# Patient Record
Sex: Male | Born: 1960 | Race: White | Hispanic: No | Marital: Married | Smoking: Never smoker
Health system: Southern US, Community
[De-identification: ages and names within clinical notes are randomized; demographics above are authoritative.]

## PROBLEM LIST (undated history)

## (undated) DIAGNOSIS — K589 Irritable bowel syndrome without diarrhea: Secondary | ICD-10-CM

## (undated) DIAGNOSIS — J358 Other chronic diseases of tonsils and adenoids: Secondary | ICD-10-CM

## (undated) DIAGNOSIS — M1990 Unspecified osteoarthritis, unspecified site: Secondary | ICD-10-CM

## (undated) DIAGNOSIS — E785 Hyperlipidemia, unspecified: Secondary | ICD-10-CM

## (undated) DIAGNOSIS — K219 Gastro-esophageal reflux disease without esophagitis: Secondary | ICD-10-CM

## (undated) HISTORY — PX: BREAST LUMPECTOMY: SHX2

## (undated) HISTORY — PX: SPINAL CORD DECOMPRESSION: SHX97

## (undated) HISTORY — DX: Hyperlipidemia, unspecified: E78.5

## (undated) HISTORY — PX: ESOPHAGOGASTRODUODENOSCOPY: SHX1529

## (undated) HISTORY — DX: Unspecified osteoarthritis, unspecified site: M19.90

## (undated) HISTORY — DX: Gastro-esophageal reflux disease without esophagitis: K21.9

## (undated) HISTORY — DX: Irritable bowel syndrome, unspecified: K58.9

## (undated) HISTORY — DX: Other chronic diseases of tonsils and adenoids: J35.8

---
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
## 1999-04-28 ENCOUNTER — Encounter: Payer: Self-pay | Admitting: Internal Medicine

## 2003-12-27 ENCOUNTER — Ambulatory Visit: Payer: Self-pay | Admitting: Family Medicine

## 2003-12-27 ENCOUNTER — Encounter: Payer: Self-pay | Admitting: Internal Medicine

## 2004-01-09 HISTORY — PX: COLONOSCOPY: SHX174

## 2004-01-09 LAB — HM COLONOSCOPY

## 2004-05-24 ENCOUNTER — Ambulatory Visit: Payer: Self-pay | Admitting: Internal Medicine

## 2004-06-23 ENCOUNTER — Ambulatory Visit: Payer: Self-pay | Admitting: Internal Medicine

## 2004-11-24 ENCOUNTER — Ambulatory Visit: Payer: Self-pay | Admitting: Family Medicine

## 2004-12-25 ENCOUNTER — Ambulatory Visit: Payer: Self-pay | Admitting: Internal Medicine

## 2005-05-15 ENCOUNTER — Ambulatory Visit: Payer: Self-pay | Admitting: Internal Medicine

## 2005-06-21 ENCOUNTER — Emergency Department: Payer: Self-pay | Admitting: Emergency Medicine

## 2005-06-21 ENCOUNTER — Other Ambulatory Visit: Payer: Self-pay

## 2005-12-14 ENCOUNTER — Ambulatory Visit: Payer: Self-pay | Admitting: Internal Medicine

## 2006-11-29 ENCOUNTER — Emergency Department: Payer: Self-pay | Admitting: Emergency Medicine

## 2006-12-12 ENCOUNTER — Encounter: Payer: Self-pay | Admitting: Internal Medicine

## 2006-12-16 ENCOUNTER — Ambulatory Visit: Payer: Self-pay | Admitting: Physician Assistant

## 2007-06-03 ENCOUNTER — Telehealth (INDEPENDENT_AMBULATORY_CARE_PROVIDER_SITE_OTHER): Payer: Self-pay | Admitting: *Deleted

## 2007-08-15 ENCOUNTER — Ambulatory Visit: Payer: Self-pay | Admitting: Internal Medicine

## 2007-08-15 DIAGNOSIS — K219 Gastro-esophageal reflux disease without esophagitis: Secondary | ICD-10-CM | POA: Insufficient documentation

## 2007-10-03 ENCOUNTER — Telehealth: Payer: Self-pay | Admitting: Internal Medicine

## 2007-10-22 ENCOUNTER — Encounter: Payer: Self-pay | Admitting: Internal Medicine

## 2007-10-22 ENCOUNTER — Ambulatory Visit: Payer: Self-pay | Admitting: Family Medicine

## 2008-02-19 ENCOUNTER — Ambulatory Visit: Payer: Self-pay | Admitting: Internal Medicine

## 2008-02-19 DIAGNOSIS — K589 Irritable bowel syndrome without diarrhea: Secondary | ICD-10-CM | POA: Insufficient documentation

## 2008-02-20 LAB — CONVERTED CEMR LAB
Cholesterol: 217 mg/dL (ref 0–200)
Triglycerides: 205 mg/dL (ref 0–149)

## 2008-05-20 ENCOUNTER — Ambulatory Visit: Payer: Self-pay | Admitting: Internal Medicine

## 2008-05-20 DIAGNOSIS — M1990 Unspecified osteoarthritis, unspecified site: Secondary | ICD-10-CM | POA: Insufficient documentation

## 2008-07-28 ENCOUNTER — Ambulatory Visit: Payer: Self-pay | Admitting: Internal Medicine

## 2008-10-22 ENCOUNTER — Ambulatory Visit: Payer: Self-pay | Admitting: Internal Medicine

## 2008-12-15 ENCOUNTER — Ambulatory Visit: Payer: Self-pay | Admitting: Family Medicine

## 2008-12-16 ENCOUNTER — Encounter: Payer: Self-pay | Admitting: Family Medicine

## 2008-12-29 ENCOUNTER — Telehealth: Payer: Self-pay | Admitting: Internal Medicine

## 2009-01-08 HISTORY — PX: VASECTOMY: SHX75

## 2009-02-07 ENCOUNTER — Encounter: Payer: Self-pay | Admitting: Internal Medicine

## 2009-04-22 ENCOUNTER — Ambulatory Visit: Payer: Self-pay | Admitting: Specialist

## 2009-05-12 ENCOUNTER — Telehealth: Payer: Self-pay | Admitting: Family Medicine

## 2009-07-27 ENCOUNTER — Telehealth: Payer: Self-pay | Admitting: Internal Medicine

## 2010-01-07 ENCOUNTER — Ambulatory Visit: Payer: Self-pay | Admitting: Neurosurgery

## 2010-02-08 ENCOUNTER — Ambulatory Visit (INDEPENDENT_AMBULATORY_CARE_PROVIDER_SITE_OTHER): Payer: BC Managed Care – PPO | Admitting: Internal Medicine

## 2010-02-08 ENCOUNTER — Encounter: Payer: Self-pay | Admitting: Internal Medicine

## 2010-02-08 DIAGNOSIS — N419 Inflammatory disease of prostate, unspecified: Secondary | ICD-10-CM | POA: Insufficient documentation

## 2010-02-08 NOTE — Progress Notes (Signed)
Summary: hemorrhoids  Phone Note Call from Patient Call back at Home Phone [phone]   Summary of Call: Patient says that he had surgery on his back last week. He did not have bowel movement for 5 days and finally had one this morning. He says that the stool was very hard and is now having hemorrhoid flare up. He want to know if he can get a new rx for anusol HC  suppositories. [facility]ses [facility].  Initial call taken by: Melody Comas,  May 12, 2009 12:25 PM    New/Updated Medications: ANUSOL-HC 25 MG SUPP (HYDROCORTISONE ACETATE) apply to rectum as needed Prescriptions: ANUSOL-HC 25 MG SUPP (HYDROCORTISONE ACETATE) apply to rectum as needed  #15 grams x 1   Entered and Authorized by:   Ruthe Mannan MD   Signed by:   Ruthe Mannan MD on 05/12/2009   Method used:   [facility]        [id number]       [address]       [place], [place]  [address]       Ph: [phone] or [phone]       Fax: [phone]   RxID:   [id number]

## 2010-02-08 NOTE — Progress Notes (Signed)
Summary: Rx Amitriptyline  Phone Note Refill Request Call back at [phone] Message [id number] on July 27, 2009 7:55 AM  Refills Requested: Medication #1:  AMITRIPTYLINE HCL 25 MG  TABS 1 or 2 at bed time as needed  Method Requested: Electronic Initial call taken by: Sydell Axon LPN,  July 27, 2009 7:55 AM  Follow-up for Phone Call        okay #60 x 1 year Follow-up by: Cindee Salt MD,  July 27, 2009 8:10 AM  Additional Follow-up for Phone Call Additional follow up Details #1::        Refill sent to pharmacy as instructed. Additional Follow-up by: Sydell Axon LPN,  July 27, 2009 9:07 AM

## 2010-02-08 NOTE — Progress Notes (Signed)
Summary: regarding anusol  Phone Note From Pharmacy   [name][facility] [phone] * Summary of Call: Pharmacy is asking for clarification on anusol.  They are asking if you want suppositories or cream.  Script was written for suppositories but quantity was ordered for cream.   Initial call taken by: Lowella Petties CMA,  May 12, 2009 3:29 PM  Follow-up for Phone Call        suppositories please. Ruthe Mannan MD  May 12, 2009 3:38 PM   Additional Follow-up for Phone Call Additional follow up Details #1::        [facility] Additional Follow-up by: Lowella Petties CMA,  May 12, 2009 3:48 PM

## 2010-02-08 NOTE — Letter (Signed)
Summary: Lenard Forth MD  Lenard Forth MD   Imported By: Sherian Rein 02/14/2009 14:21:18  _____________________________________________________________________  External Attachment:    Type:   Image     Comment:   External Document  Appended Document: Lenard Forth MD left elbow injected

## 2010-02-15 ENCOUNTER — Encounter: Payer: Self-pay | Admitting: Internal Medicine
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
## 2010-02-16 ENCOUNTER — Encounter: Payer: Self-pay | Admitting: Internal Medicine

## 2010-02-16 ENCOUNTER — Other Ambulatory Visit: Payer: Self-pay | Admitting: Internal Medicine

## 2010-02-16 ENCOUNTER — Encounter (INDEPENDENT_AMBULATORY_CARE_PROVIDER_SITE_OTHER): Payer: BC Managed Care – PPO | Admitting: Internal Medicine

## 2010-02-16 DIAGNOSIS — K589 Irritable bowel syndrome without diarrhea: Secondary | ICD-10-CM

## 2010-02-16 DIAGNOSIS — Z0000 Encounter for general adult medical examination without abnormal findings: Secondary | ICD-10-CM

## 2010-02-16 LAB — CBC WITH DIFFERENTIAL/PLATELET
Basophils Relative: 0.3 % (ref 0.0–3.0)
Eosinophils Absolute: 0 10*3/uL (ref 0.0–0.7)
Eosinophils Relative: 0.3 % (ref 0.0–5.0)
Hemoglobin: 16.8 g/dL (ref 13.0–17.0)
Lymphocytes Relative: 26.6 % (ref 12.0–46.0)
MCHC: 35.8 g/dL (ref 30.0–36.0)
MCV: 90.1 fl (ref 78.0–100.0)
Neutro Abs: 4.5 10*3/uL (ref 1.4–7.7)
Neutrophils Relative %: 67.1 % (ref 43.0–77.0)
RBC: 5.23 Mil/uL (ref 4.22–5.81)
WBC: 6.7 10*3/uL (ref 4.5–10.5)

## 2010-02-16 LAB — HEPATIC FUNCTION PANEL
ALT: 23 U/L (ref 0–53)
Albumin: 4.8 g/dL (ref 3.5–5.2)
Alkaline Phosphatase: 48 U/L (ref 39–117)
Bilirubin, Direct: 0.2 mg/dL (ref 0.0–0.3)
Total Protein: 7.4 g/dL (ref 6.0–8.3)

## 2010-02-16 LAB — RENAL FUNCTION PANEL
Albumin: 4.8 g/dL (ref 3.5–5.2)
Calcium: 9.7 mg/dL (ref 8.4–10.5)
Chloride: 100 mEq/L (ref 96–112)
Glucose, Bld: 86 mg/dL (ref 70–99)
Phosphorus: 3.5 mg/dL (ref 2.3–4.6)
Potassium: 4.6 mEq/L (ref 3.5–5.1)
Sodium: 137 mEq/L (ref 135–145)
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
## 2010-05-03 ENCOUNTER — Other Ambulatory Visit: Payer: Self-pay | Admitting: *Deleted

## 2010-05-03 MED ORDER — HYDROCORTISONE ACETATE 25 MG RE SUPP: 25.0000 mg | Freq: Two times a day (BID) | RECTAL | Status: AC

## 2010-05-03 NOTE — Telephone Encounter (Signed)
Script called to [facility].

## 2010-05-03 NOTE — Telephone Encounter (Signed)
Reasonable,   Refill #30, 0 refills 1 PR bid prn hemorrhoids

## 2010-05-03 NOTE — Telephone Encounter (Signed)
[facility] on anusol HC suppostories.  This is no longer on pt's med list.  Pt is leaving the country tomorrow morning and is asking that this be called to pharmacy today.  Dr. Alphonsus Sias is gone for the rest of the week.

## 2010-07-10 ENCOUNTER — Ambulatory Visit: Payer: BC Managed Care – PPO | Admitting: Internal Medicine

## 2010-07-10 ENCOUNTER — Encounter: Payer: Self-pay | Admitting: Internal Medicine

## 2010-07-13 ENCOUNTER — Ambulatory Visit (INDEPENDENT_AMBULATORY_CARE_PROVIDER_SITE_OTHER): Payer: BC Managed Care – PPO | Admitting: Internal Medicine

## 2010-07-13 ENCOUNTER — Encounter: Payer: Self-pay | Admitting: Internal Medicine

## 2010-07-13 VITALS — BP 110/70 | HR 89 | Temp 98.4°F | Ht 69.0 in | Wt 219.0 lb

## 2010-07-13 DIAGNOSIS — G479 Sleep disorder, unspecified: Secondary | ICD-10-CM | POA: Insufficient documentation

## 2010-07-13 NOTE — Patient Instructions (Addendum)
Please set up evaluation by the sleep doctor

## 2010-07-13 NOTE — Assessment & Plan Note (Signed)
Doesn't have the physique or throat that suggests apnea Mild daytime somnolence Will set up sleep evaluation to decide if sleep study is appropriate
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
## 2010-08-11 ENCOUNTER — Institutional Professional Consult (permissible substitution): Payer: BC Managed Care – PPO | Admitting: Pulmonary Disease

## 2010-08-21 ENCOUNTER — Other Ambulatory Visit: Payer: Self-pay | Admitting: Internal Medicine

## 2010-08-21 NOTE — Telephone Encounter (Signed)
rx sent to pharmacy by e-script  

## 2010-10-12 ENCOUNTER — Ambulatory Visit (INDEPENDENT_AMBULATORY_CARE_PROVIDER_SITE_OTHER): Payer: BC Managed Care – PPO | Admitting: Family Medicine

## 2010-10-12 ENCOUNTER — Encounter: Payer: Self-pay | Admitting: Family Medicine

## 2010-10-12 DIAGNOSIS — R519 Headache, unspecified: Secondary | ICD-10-CM

## 2010-10-12 DIAGNOSIS — R51 Headache: Secondary | ICD-10-CM

## 2010-10-12 MED ORDER — CYCLOBENZAPRINE HCL 10 MG PO TABS: 10.0000 mg | ORAL_TABLET | Freq: Three times a day (TID) | ORAL | Status: DC | PRN

## 2010-10-12 MED ORDER — NABUMETONE 500 MG PO TABS: 500.0000 mg | ORAL_TABLET | Freq: Two times a day (BID) | ORAL | Status: DC

## 2010-10-12 NOTE — Assessment & Plan Note (Addendum)
After examining, pt realized he had stopped his chronic muscle relaxer (Flexeril) and Relafen) and then  his headaches started. I think even more firmly after hearing that that he is having muscle strain/spasm partly from stress and strain and partly from sensitivity of muscle fibers from acutely stopping medication. See instructions.

## 2010-10-12 NOTE — Progress Notes (Signed)
  Subjective:    Patient ID: Kyle Robinson, male    DOB: [birth year], 50 y.o.   MRN: [id number]  HPI Pt of Dr Karle Starch here as acute appt who was seen at [facility] for headache which brings him in today. He woke up after taking a nap after church and it starts at the base of his neck and goes up into the left posterior  base of his skull that then wraps around to the left eye and lateral frontal area. He has tried heat and ice. He went to [facility] in [place], put on Skelaxin 800mg , Tramadol 37.5/325tyl and Amox 500. It has eased off some but is still there.  Sat the 29th he went shopping and denies weird positions, trauma, accdt. He denies travel except occas to stores in [place] but not in the last month. H/A better when laying back on his back. He denies chills but has had some sweatiness at times. He has also been nauseous at times. He is now on Amox as above. Headache as above and he gets headaches rarely. No ear pain, no rhinitis, no ST and no cough. A little nausea since being on the medications from UC, no vomitting, no diarrhea. He again says no head trauma except for three weeks ago working on his lawnmower and hit the top of his head scraping a little skin from the direct top of his head on a little light projecting from the lawnmower. He was seen at [facility] on the 30th.  He has been using heating pad at a time once a day (total of twice since the 30th) and ice a couple of nites for 15 mins at a time.  He denies neurologic changes. He has had no vision changes.    Review of SystemsNoncontributory except as above.       Objective:   Physical Exam  Constitutional: He is oriented to person, place, and time. He appears well-developed and well-nourished. No distress.  HENT:  Head: Normocephalic and atraumatic.  Right Ear: External ear normal.  Left Ear: External ear normal.  Nose: Nose normal.  Mouth/Throat: Oropharynx is clear and moist.  Eyes: Conjunctivae and EOM are normal. Pupils are  equal, round, and reactive to light. Right eye exhibits no discharge. Left eye exhibits no discharge.  Neck: Normal range of motion. Neck supple.  Cardiovascular: Normal rate and regular rhythm.   Pulmonary/Chest: Effort normal and breath sounds normal. He has no wheezes.  Lymphadenopathy:    He has no cervical adenopathy.  Neurological: He is alert and oriented to person, place, and time. He has normal reflexes. He displays normal reflexes. No cranial nerve deficit. He exhibits normal muscle tone. Coordination normal.       HTS, FTN, RAM, Heel and Toe walk, Romberg all nml.  Skin: He is not diaphoretic.          Assessment & Plan:

## 2010-10-12 NOTE — Patient Instructions (Addendum)
Start back on Flexeril and Relafen (with food) and use heat and ice aggeressively. Discussed TAPERING meds when sxs chronically better. Call if sxs don't improve and will send to PT ( he was resistant to this initially) 30 mins spent with pt.

## 2010-12-28 ENCOUNTER — Telehealth: Payer: Self-pay | Admitting: Internal Medicine

## 2011-01-05 ENCOUNTER — Encounter: Payer: Self-pay | Admitting: Internal Medicine

## 2011-01-05 ENCOUNTER — Ambulatory Visit (INDEPENDENT_AMBULATORY_CARE_PROVIDER_SITE_OTHER): Payer: BC Managed Care – PPO | Admitting: Internal Medicine

## 2011-01-05 VITALS — BP 126/80 | HR 62 | Temp 97.8°F | Ht 69.0 in | Wt 224.0 lb

## 2011-01-05 DIAGNOSIS — J209 Acute bronchitis, unspecified: Secondary | ICD-10-CM | POA: Insufficient documentation

## 2011-01-05 MED ORDER — AMOXICILLIN 500 MG PO TABS: 1000.0000 mg | ORAL_TABLET | Freq: Two times a day (BID) | ORAL | Status: AC

## 2011-01-05 MED ORDER — TRAMADOL HCL 50 MG PO TBDP: 50.0000 mg | ORAL_TABLET | Freq: Every evening | ORAL | Status: DC | PRN
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
## 2011-01-05 NOTE — Assessment & Plan Note (Signed)
Never cleared from test confirmed flu Ongoing mucus and some blood May have secondary bacterial infection Discussed supportive care

## 2011-01-10 ENCOUNTER — Telehealth: Payer: Self-pay | Admitting: Internal Medicine

## 2011-01-10 NOTE — Telephone Encounter (Signed)
Patient is complaining of a dry cough that is consistent.  Was seen by Dr. Alphonsus Sias on the 28th of December and was prescribed Tramadol which he takes 2 tablets at night and it controls it at night, but the cough during the day is consistent and wanted to know what suggestions you might have.

## 2011-01-10 NOTE — Telephone Encounter (Signed)
Patient advised.

## 2011-01-10 NOTE — Telephone Encounter (Signed)
mucinex-DM or robitussin-DM

## 2011-01-16 NOTE — Telephone Encounter (Signed)
Opened in error

## 2011-01-18 ENCOUNTER — Other Ambulatory Visit: Payer: Self-pay | Admitting: *Deleted

## 2011-01-18 ENCOUNTER — Telehealth: Payer: Self-pay | Admitting: *Deleted

## 2011-01-18 MED ORDER — AMOXICILLIN-POT CLAVULANATE 875-125 MG PO TABS: 1.0000 | ORAL_TABLET | Freq: Two times a day (BID) | ORAL | Status: DC

## 2011-01-18 NOTE — Telephone Encounter (Signed)
Probably should try again but something stronger Rx augmentin 875 bid x 10 days Take with food #20 x 0

## 2011-01-18 NOTE — Telephone Encounter (Signed)
Patient advised and rx sent in. 

## 2011-01-18 NOTE — Telephone Encounter (Signed)
Patient says that he was treated for flu in December then he was given amoxicillin and tramadol for sinus infection and bronchitis. Patient calling now saying that he still feels really bad with cough congestion in chest and wants to know what he needs to do. The has taken mucinex dm and also tussin and finished both bottles. Patient says that he didn't feel any better when he was taken amoxicillin. Patient says that he is coughing up yellow phelgm in the morning but, cough dry during the day. Patient has had no fever. [facility] [place]

## 2011-01-19 NOTE — Telephone Encounter (Signed)
rx was already called in and pt picked up.

## 2011-03-13 ENCOUNTER — Encounter: Payer: Self-pay | Admitting: Internal Medicine

## 2011-03-13 ENCOUNTER — Ambulatory Visit (INDEPENDENT_AMBULATORY_CARE_PROVIDER_SITE_OTHER): Payer: BC Managed Care – PPO | Admitting: Internal Medicine

## 2011-03-13 VITALS — BP 120/80 | HR 87 | Temp 98.2°F | Ht 73.0 in | Wt 230.0 lb

## 2011-03-13 DIAGNOSIS — K219 Gastro-esophageal reflux disease without esophagitis: Secondary | ICD-10-CM

## 2011-03-13 DIAGNOSIS — M5416 Radiculopathy, lumbar region: Secondary | ICD-10-CM

## 2011-03-13 DIAGNOSIS — Z0000 Encounter for general adult medical examination without abnormal findings: Secondary | ICD-10-CM

## 2011-03-13 DIAGNOSIS — Z23 Encounter for immunization: Secondary | ICD-10-CM

## 2011-03-13 DIAGNOSIS — IMO0002 Reserved for concepts with insufficient information to code with codable children: Secondary | ICD-10-CM

## 2011-03-13 DIAGNOSIS — G8929 Other chronic pain: Secondary | ICD-10-CM

## 2011-03-13 LAB — LIPID PANEL
Cholesterol: 216 mg/dL — ABNORMAL HIGH (ref 0–200)
HDL: 33.7 mg/dL — ABNORMAL LOW (ref >39.00)
Total CHOL/HDL Ratio: 6
Triglycerides: 199 mg/dL — ABNORMAL HIGH (ref 0.0–149.0)
VLDL: 39.8 mg/dL (ref 0.0–40.0)

## 2011-03-13 LAB — GLUCOSE, RANDOM: Glucose, Bld: 94 mg/dL (ref 70–99)

## 2011-03-13 NOTE — Assessment & Plan Note (Signed)
Healthy but has let himself go Discussed fitness Tdap today Must give up sugared drinks

## 2011-03-13 NOTE — Progress Notes (Signed)
  Subjective:    Patient ID: Kyle Robinson, male    DOB: 05-Aug-1960, 51 y.o.   MRN: [id number]  HPI Has had recurrent colds Saw a white spot on the back of his throat Lots of drainage Started about a week ago No fever or SOB  Back pain is still a daily problem Feels like a pressure which radiates to hips Tried nabumetone again for 2 weeks---no help Still takes the amitriptylline  Occ urinary urgency but not consistent Flow is variable also No sexual problems  Uses zantac in AM still Rarely uses at night Has been quiet with this  Uses colace bid occ dripping red blood and uses suppository Did have hemorrhoids treated but still has some blood if he eats certain things  Review of Systems  Constitutional: Positive for unexpected weight change.       Gained weight on cruise Also had soda and sweet tea--has stopped Plans to restart exercise Wears seat belt  HENT: Positive for dental problem. Negative for hearing loss, congestion, rhinorrhea and tinnitus.        Still with episodic "geographic tongue" Keeps up with dentist  Eyes: Negative for visual disturbance.       No diplopia or unilateral vision loss  Respiratory: Negative for cough, chest tightness and shortness of breath.   Cardiovascular: Negative for chest pain, palpitations and leg swelling.  Gastrointestinal: Positive for constipation and anal bleeding. Negative for nausea, vomiting and blood in stool.       Heartburn controlled  Genitourinary: Positive for urgency, frequency and difficulty urinating.       Prostate thing? No sexual problems  Musculoskeletal: Positive for back pain and arthralgias. Negative for joint swelling.       Back pain to hips  Skin: Negative for rash.       No suspicious areas  Neurological: Positive for headaches. Negative for dizziness, syncope, weakness, light-headedness and numbness.       Occ headaches--uses tylenol  Hematological: Negative for adenopathy. Does not bruise/bleed  easily.  Psychiatric/Behavioral: Negative for sleep disturbance and dysphoric mood. The patient is not nervous/anxious.        Objective:   Physical Exam  Constitutional: He is oriented to person, place, and time. He appears well-developed and well-nourished. No distress.  HENT:  Head: Normocephalic and atraumatic.  Right Ear: External ear normal.  Left Ear: External ear normal.  Mouth/Throat: Oropharynx is clear and moist. No oropharyngeal exudate.  Eyes: Conjunctivae and EOM are normal. Pupils are equal, round, and reactive to light.  Neck: Normal range of motion. Neck supple. No thyromegaly present.  Cardiovascular: Normal rate, regular rhythm, normal heart sounds and intact distal pulses.  Exam reveals no gallop.   No murmur heard. Pulmonary/Chest: Effort normal and breath sounds normal. No respiratory distress. He has no wheezes. He has no rales.  Abdominal: Soft. There is no tenderness.  Musculoskeletal: He exhibits no edema and no tenderness.  Lymphadenopathy:    He has no cervical adenopathy.  Neurological: He is alert and oriented to person, place, and time.  Skin: No rash noted. No erythema.  Psychiatric: He has a normal mood and affect. His behavior is normal. Judgment and thought content normal.          Assessment & Plan:

## 2011-03-13 NOTE — Assessment & Plan Note (Signed)
Okay with the zantac

## 2011-03-13 NOTE — Assessment & Plan Note (Signed)
Ongoing Uses the amitriptylline

## 2011-07-10 ENCOUNTER — Encounter: Payer: Self-pay | Admitting: Internal Medicine

## 2011-07-10 ENCOUNTER — Ambulatory Visit (INDEPENDENT_AMBULATORY_CARE_PROVIDER_SITE_OTHER): Payer: BC Managed Care – PPO | Admitting: Internal Medicine

## 2011-07-10 VITALS — BP 120/80 | HR 76 | Temp 98.0°F | Wt 228.0 lb

## 2011-07-10 DIAGNOSIS — K439 Ventral hernia without obstruction or gangrene: Secondary | ICD-10-CM

## 2011-07-10 NOTE — Assessment & Plan Note (Signed)
Due to diastasis recti No Rx needed Discussed weight loss and core strengthening
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
## 2011-10-31 ENCOUNTER — Other Ambulatory Visit: Payer: Self-pay | Admitting: *Deleted

## 2011-10-31 MED ORDER — AMITRIPTYLINE HCL 25 MG PO TABS: 25.0000 mg | ORAL_TABLET | Freq: Every evening | ORAL | Status: DC | PRN

## 2012-02-13 ENCOUNTER — Other Ambulatory Visit: Payer: Self-pay | Admitting: Family Medicine

## 2012-02-14 NOTE — Telephone Encounter (Signed)
Okay #30 x 1 

## 2012-02-14 NOTE — Telephone Encounter (Signed)
rx sent to pharmacy by e-script  

## 2012-03-12 ENCOUNTER — Encounter: Payer: Self-pay | Admitting: Internal Medicine

## 2012-03-12 ENCOUNTER — Ambulatory Visit (INDEPENDENT_AMBULATORY_CARE_PROVIDER_SITE_OTHER): Payer: BC Managed Care – PPO | Admitting: Internal Medicine

## 2012-03-12 VITALS — BP 112/70 | HR 72 | Temp 98.0°F | Wt 220.0 lb

## 2012-03-12 DIAGNOSIS — Z0000 Encounter for general adult medical examination without abnormal findings: Secondary | ICD-10-CM

## 2012-03-12 DIAGNOSIS — K090 Developmental odontogenic cysts: Secondary | ICD-10-CM | POA: Insufficient documentation

## 2012-03-12 DIAGNOSIS — K589 Irritable bowel syndrome without diarrhea: Secondary | ICD-10-CM

## 2012-03-12 DIAGNOSIS — K219 Gastro-esophageal reflux disease without esophagitis: Secondary | ICD-10-CM

## 2012-03-12 DIAGNOSIS — L570 Actinic keratosis: Secondary | ICD-10-CM | POA: Insufficient documentation

## 2012-03-12 DIAGNOSIS — E785 Hyperlipidemia, unspecified: Secondary | ICD-10-CM | POA: Insufficient documentation

## 2012-03-12 DIAGNOSIS — Z125 Encounter for screening for malignant neoplasm of prostate: Secondary | ICD-10-CM

## 2012-03-12 DIAGNOSIS — K055 Other periodontal diseases: Secondary | ICD-10-CM

## 2012-03-12 LAB — CBC WITH DIFFERENTIAL/PLATELET
Basophils Relative: 0.3 % (ref 0.0–3.0)
Eosinophils Absolute: 0.1 10*3/uL (ref 0.0–0.7)
Eosinophils Relative: 1.5 % (ref 0.0–5.0)
HCT: 47.3 % (ref 39.0–52.0)
Lymphs Abs: 1.6 10*3/uL (ref 0.7–4.0)
MCHC: 33.8 g/dL (ref 30.0–36.0)
MCV: 87.2 fl (ref 78.0–100.0)
Monocytes Absolute: 0.4 10*3/uL (ref 0.1–1.0)
Neutrophils Relative %: 55.1 % (ref 43.0–77.0)
RBC: 5.43 Mil/uL (ref 4.22–5.81)
WBC: 4.6 10*3/uL (ref 4.5–10.5)

## 2012-03-12 LAB — LIPID PANEL
LDL Cholesterol: 144 mg/dL — ABNORMAL HIGH (ref 0–99)
Total CHOL/HDL Ratio: 7
Triglycerides: 125 mg/dL (ref 0.0–149.0)

## 2012-03-12 LAB — BASIC METABOLIC PANEL
BUN: 21 mg/dL (ref 6–23)
Chloride: 106 mEq/L (ref 96–112)
GFR: 78.35 mL/min (ref >60.00)
Potassium: 4.7 mEq/L (ref 3.5–5.1)

## 2012-03-12 LAB — HEPATIC FUNCTION PANEL
ALT: 22 U/L (ref 0–53)
AST: 16 U/L (ref 0–37)
Albumin: 4.2 g/dL (ref 3.5–5.2)
Total Bilirubin: 1 mg/dL (ref 0.3–1.2)
Total Protein: 6.7 g/dL (ref 6.0–8.3)

## 2012-03-12 LAB — PSA: PSA: 0.72 ng/mL (ref 0.10–4.00)

## 2012-03-12 MED ORDER — AMOXICILLIN 500 MG PO TABS: 1000.0000 mg | ORAL_TABLET | Freq: Two times a day (BID) | ORAL | Status: DC

## 2012-03-12 NOTE — Assessment & Plan Note (Signed)
Controlled with ranitidine 

## 2012-03-12 NOTE — Assessment & Plan Note (Signed)
Discussed lifestyle No meds for now

## 2012-03-12 NOTE — Assessment & Plan Note (Signed)
Uses the amitriptylline for this

## 2012-03-12 NOTE — Assessment & Plan Note (Signed)
Treated with liquid nitrogen for 45 seconds x 2
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
## 2012-03-12 NOTE — Assessment & Plan Note (Signed)
Under left lower canine Will treat with amoxil and urged him to set up with dentist to see if further action needed

## 2012-03-12 NOTE — Assessment & Plan Note (Signed)
Fairly healthy Has worked on cutting out sugar Discussed exercise Will check PSA after discussion

## 2012-06-25 ENCOUNTER — Emergency Department: Payer: Self-pay | Admitting: Emergency Medicine

## 2012-07-28 ENCOUNTER — Telehealth: Payer: Self-pay | Admitting: Internal Medicine

## 2012-07-28 ENCOUNTER — Encounter: Payer: Self-pay | Admitting: Family Medicine

## 2012-07-28 ENCOUNTER — Ambulatory Visit (INDEPENDENT_AMBULATORY_CARE_PROVIDER_SITE_OTHER): Payer: BC Managed Care – PPO | Admitting: Family Medicine

## 2012-07-28 VITALS — BP 130/80 | HR 84 | Temp 98.0°F | Wt 227.2 lb

## 2012-07-28 DIAGNOSIS — L089 Local infection of the skin and subcutaneous tissue, unspecified: Secondary | ICD-10-CM | POA: Insufficient documentation

## 2012-07-28 DIAGNOSIS — IMO0002 Reserved for concepts with insufficient information to code with codable children: Secondary | ICD-10-CM

## 2012-07-28 MED ORDER — BACITRACIN-NEOMYCIN-POLYMYXIN 400-5-5000 EX OINT: TOPICAL_OINTMENT | Freq: Two times a day (BID) | CUTANEOUS | Status: DC

## 2012-07-28 NOTE — Telephone Encounter (Signed)
Patient already had appointment with Dr.Gutierrez.  He'll keep the appointment with Dr.Gutierrez.
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
## 2012-07-28 NOTE — Telephone Encounter (Signed)
He needs to be seen today Please tell him 4:30 also (as it may take some time to check in the new baby) Let him know he may need to wait some but I will definitely see him today

## 2012-07-28 NOTE — Assessment & Plan Note (Signed)
Actually erythema regressing since started amoxicillin course - recommended he finish course for cellulitis complicating large abrasions of right arm. Prescribed triple abx ointment as well to use for next few days. Discussed wound care. Discussed red flags to seek urgent care. UTD Tdap (2013).
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
## 2012-07-28 NOTE — Patient Instructions (Signed)
Finish amoxicillin 10 day course. May use antibiotic ointment provided today - keep covered when active during day, then uncover at night. Wash twice daily. Let us know if any spreading redness again, any draining pus, or any other concerns.

## 2012-11-13 ENCOUNTER — Other Ambulatory Visit: Payer: Self-pay

## 2012-11-28 ENCOUNTER — Other Ambulatory Visit: Payer: Self-pay | Admitting: Internal Medicine

## 2013-01-30 ENCOUNTER — Encounter: Payer: Self-pay | Admitting: Internal Medicine

## 2013-01-30 ENCOUNTER — Telehealth: Payer: Self-pay

## 2013-01-30 ENCOUNTER — Ambulatory Visit (INDEPENDENT_AMBULATORY_CARE_PROVIDER_SITE_OTHER): Payer: BC Managed Care – PPO | Admitting: Internal Medicine

## 2013-01-30 VITALS — BP 136/98 | HR 102 | Temp 98.7°F | Wt 233.5 lb

## 2013-01-30 DIAGNOSIS — J019 Acute sinusitis, unspecified: Secondary | ICD-10-CM

## 2013-01-30 MED ORDER — AMOXICILLIN-POT CLAVULANATE 875-125 MG PO TABS: 1.0000 | ORAL_TABLET | Freq: Two times a day (BID) | ORAL | Status: DC

## 2013-01-30 NOTE — Progress Notes (Signed)
HPI  Pt presents to the clinic today with c/o lesions in his mouth, nasal congestion, headache, cough and ear pain. This started 5 days ago. He does reports the cough is productive of thick green/brown sputum. He did go to [facility]. His rapid strep was negative. He was started on Claritin at that time. He has no history of allergies or breathing problems.  Review of Systems      Past Medical History  Diagnosis Date  . GERD (gastroesophageal reflux disease)   . IBS (irritable bowel syndrome)   . Arthritis   . Tonsillar tag     12/09 Left tonsillar tag/polyp  removed [name])  . Hyperlipidemia     Family History  Problem Relation Age of Onset  . Coronary artery disease Father   . Coronary artery disease Sister   . Coronary artery disease Brother   . Hypertension Neg Hx   . Diabetes Neg Hx     History   Social History  . Marital Status: Married    Spouse Name: N/A    Number of Children: 2  . Years of Education: N/A   Occupational History  . 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 Smoking status: Never Smoker   . Smokeless tobacco: Never Used  . Alcohol Use: No  . Drug Use: No  . Sexual Activity: Not on file   Other Topics Concern  . Not on file   Social History Narrative  . No narrative on file    Allergies  Allergen Reactions  . Ibuprofen   . Sulfonamide Derivatives      Constitutional: Positive headache, fatigue and fever. Denies abrupt weight changes.  HEENT:  Positive sore throat, nasal congestion, cough. Denies eye redness, eye pain, pressure behind the eyes, facial pain, ear pain, ringing in the ears, wax buildup, runny nose or bloody nose. Respiratory: Positive cough. Denies difficulty breathing or shortness of breath.  Cardiovascular: Denies chest pain, chest tightness, palpitations or swelling in the hands or feet.   No other specific complaints in a complete review of systems (except as listed in HPI  above).  Objective:   BP 136/98  Pulse 102  Temp(Src) 98.7 F (37.1 C) (Oral)  Wt 233 lb 8 oz (105.915 kg)  SpO2 97% Wt Readings from Last 3 Encounters:  01/30/13 233 lb 8 oz (105.915 kg)  07/28/12 227 lb 4 oz (103.08 kg)  03/12/12 220 lb (99.791 kg)     General: Appears his stated age, well developed, well nourished in NAD. HEENT: Head: normal shape and size, frontal sinus tenderness; Eyes: sclera white, no icterus, conjunctiva pink, PERRLA and EOMs intact; Ears: Tm's gray and intact, dull light reflex; Nose: mucosa pink and moist, septum midline; Throat/Mouth: + PND. Teeth present, mucosa erythematous and moist, no exudate noted, no lesions or ulcerations noted.  Neck:  Neck supple, trachea midline. No massses, lumps or thyromegaly present.  Cardiovascular: Normal rate and rhythm. S1,S2 noted.  No murmur, rubs or gallops noted. No JVD or BLE edema. No carotid bruits noted. Pulmonary/Chest: Normal effort and positive vesicular breath sounds. No respiratory distress. No wheezes, rales or ronchi noted.      Assessment & Plan:   Acute Sinusitis:  Get some rest and drink plenty of water eRx for Augmentin BID x 10 days Continue Claritin OTC  RTC as needed or if symptoms persist.

## 2013-01-30 NOTE — Progress Notes (Signed)
Pre-visit discussion using our clinic review tool. No additional management support is needed unless otherwise documented below in the visit note.  

## 2013-01-30 NOTE — Addendum Note (Signed)
Addended by: Jearld Fenton on: 01/30/2013 03:28 PM   Modules accepted: Orders

## 2013-01-30 NOTE — Patient Instructions (Signed)

## 2013-01-30 NOTE — Telephone Encounter (Signed)
Left msg on vm letting pt know the antibiotic was sent to the pharmacy

## 2013-02-03 ENCOUNTER — Telehealth: Payer: Self-pay | Admitting: Family Medicine

## 2013-02-03 NOTE — Telephone Encounter (Signed)
Please call to check on him today.

## 2013-02-03 NOTE — Telephone Encounter (Signed)
[facility] Triage Call Report Triage Record Num: [id number] Operator: Argie Ramming Patient Name: Kyle Robinson Call Date & Time: 02/02/2013 9:32:57PM Patient Phone: [phone] PCP: Viviana Simpler Patient Gender: Male PCP Fax : [phone] Patient DOB: August 02, 1960 Practice Name: [facility] Reason for Call: [name]: Kyle Robinson/Patient; PCP: Viviana Simpler (Family Practice); CB#: [phone]; Onset: 01/30/13; Afebrile Call regarding Cough/Congestion; Office visit on 01/30/13 Sinus Infection and fluid behind ears started on Augmentin 875mg . Non productive cough since 01/31/13. Headache present from coughing. Triaged Cough-Adult. + for recurrent episode of uncontrolled coughing interfering with ability to carry out usual activities or with normal sleep patterns. Disposition: See provider within 24 hours. Care advice: per guidlelines. Pt will call 02/03/13 to schedule appt. Protocol(s) Used: Cough - Adult Recommended Outcome per Protocol: See Provider within 24 hours Reason for Outcome: Recurrent episodes of uncontrolled coughing interfering with ability to carry out usual activities or with normal sleep patterns Care Advice: ~ Call provider for appointment. Try to identify possible situations or irritants that triggered your symptoms (including medications) and be sure to tell your provider. ~ Drink more fluids -- water, low-sugar juices, tea and warm soup, especially chicken broth, are options. Avoid caffeinated or alcoholic beverages because they can increase the chance of dehydration. ~ ~ SYMPTOM / CONDITION MANAGEMENT 02/02/2013 10:03:57PM Page 1 of 1 [id number]

## 2013-02-03 NOTE — Telephone Encounter (Signed)
Pt scheduled appt at 10:15am tomorrow, per pt still with cough and non productive cough would like something else called in but I advised him he would need to be seen.

## 2013-02-04 ENCOUNTER — Ambulatory Visit: Payer: BC Managed Care – PPO | Admitting: Internal Medicine

## 2013-02-04 NOTE — Telephone Encounter (Signed)
Will see this morning

## 2013-02-17 ENCOUNTER — Telehealth: Payer: Self-pay | Admitting: Internal Medicine

## 2013-02-17 ENCOUNTER — Emergency Department: Payer: Self-pay | Admitting: Emergency Medicine

## 2013-02-17 LAB — BASIC METABOLIC PANEL
Anion Gap: 3 — ABNORMAL LOW (ref 7–16)
BUN: 14 mg/dL (ref 7–18)
CALCIUM: 9.3 mg/dL (ref 8.5–10.1)
CHLORIDE: 106 mmol/L (ref 98–107)
CO2: 31 mmol/L (ref 21–32)
CREATININE: 1.04 mg/dL (ref 0.60–1.30)
EGFR (Non-African Amer.): >60
Glucose: 83 mg/dL (ref 65–99)
Osmolality: 279 (ref 275–301)
Potassium: 3.8 mmol/L (ref 3.5–5.1)
Sodium: 140 mmol/L (ref 136–145)

## 2013-02-17 LAB — CBC
HCT: 47.4 % (ref 40.0–52.0)
HGB: 16.4 g/dL (ref 13.0–18.0)
MCH: 30.7 pg (ref 26.0–34.0)
MCHC: 34.6 g/dL (ref 32.0–36.0)
MCV: 89 fL (ref 80–100)
Platelet: 273 10*3/uL (ref 150–440)
RBC: 5.35 10*6/uL (ref 4.40–5.90)
RDW: 12.4 % (ref 11.5–14.5)
WBC: 6.6 10*3/uL (ref 3.8–10.6)

## 2013-02-17 LAB — TROPONIN I: Troponin-I: <0.02 ng/mL

## 2013-02-17 NOTE — Telephone Encounter (Signed)
Patient Information:  [name] Name: Jacqulynn Cadet  Phone: [phone]  Patient: Kyle, Robinson  Gender: Male  DOB: 04-Dec-1960  Age: 53 Years  PCP: Viviana Simpler [facility])  Office Follow Up:  Does the office need to follow up with this patient?: No  Instructions For The Office: N/A  RN Note:  Advised caller to activate ems 911 and explained to him importance for medical assessment and treatment with verbal understanding but still says he may drive himself in.  Advised to chew 325mg  asa with small sip of water, he should not drive and it is safest for him to call 911.  Symptoms  Reason For Call & Symptoms: Chest pain for a couple of days;  Feels something fluttering deep in chest when he lays on his left side;  2/9 didn't feel well, layed in bed more and again today; At time of call has pain  to left chest into axillary area, has a twinging feeling that radiates up into shoulder;  Pain in chest feels tight;  Pain comes and goes, has lasted up to 30 minutes;  Reviewed Health History In EMR: Yes  Reviewed Medications In EMR: Yes  Reviewed Allergies In EMR: Yes  Reviewed Surgeries / Procedures: Yes  Date of Onset of Symptoms: 02/15/2013  Treatments Tried: Asa 81 mg daily started  Treatments Tried Worked: No  Guideline(s) Used:  Chest Pain  Disposition Per Guideline:   Call EMS 911 Now  Reason For Disposition Reached:   Chest pain lasting longer than 5 minutes and ANY of the following:  Over 67 years old Over 29 years old and at least one cardiac risk factor (i.e., high blood pressure, diabetes, high cholesterol, obesity, smoker or strong family history of heart disease) Pain is crushing, pressure-like, or heavy  Took nitroglycerin and chest pain was not relieved History of heart disease (i.e., angina, heart attack, bypass surgery, angioplasty, CHF)  Advice Given:  N/A  Patient Refused Recommendation:  Patient Will Go To ED  Patient says he will drive himself in despite  recommedations.

## 2013-02-17 NOTE — Telephone Encounter (Signed)
Please check on him in the morning

## 2013-02-18 NOTE — Telephone Encounter (Signed)
Pt went to ED, scheduled for stress test next Wednesday, feeling better, all tests at [facility] was normal

## 2013-04-22 DIAGNOSIS — M75100 Unspecified rotator cuff tear or rupture of unspecified shoulder, not specified as traumatic: Secondary | ICD-10-CM | POA: Insufficient documentation

## 2013-09-17 ENCOUNTER — Ambulatory Visit (INDEPENDENT_AMBULATORY_CARE_PROVIDER_SITE_OTHER): Payer: BC Managed Care – PPO | Admitting: Internal Medicine

## 2013-09-17 ENCOUNTER — Encounter: Payer: Self-pay | Admitting: Internal Medicine

## 2013-09-17 VITALS — BP 124/82 | HR 74 | Temp 97.8°F | Wt 223.5 lb

## 2013-09-17 DIAGNOSIS — J392 Other diseases of pharynx: Secondary | ICD-10-CM

## 2013-09-17 MED ORDER — AMOXICILLIN 875 MG PO TABS: 875.0000 mg | ORAL_TABLET | Freq: Two times a day (BID) | ORAL | Status: DC

## 2013-09-17 NOTE — Patient Instructions (Addendum)
Oral Ulcers Oral ulcers are painful, shallow sores around the lining of the mouth. They can affect the gums, the inside of the lips, and the cheeks. (Sores on the outside of the lips and on the face are different.) They typically first occur in school-aged children and teenagers. Oral ulcers may also be called canker sores or cold sores. CAUSES  Canker sores and cold sores can be caused by many factors including:  Infection.  Injury.  Sun exposure.  Medications.  Emotional stress.  Food allergies.  Vitamin deficiencies.  Toothpastes containing sodium lauryl sulfate. The herpes virus can be the cause of mouth ulcers. The first infection can be severe and cause 10 or more ulcers on the gums, tongue, and lips with fever and difficulty in swallowing. This infection usually occurs between the ages of 1 and 3 years.  SYMPTOMS  The typical sore is about  inch (6 mm) in size and is an oval or round ulcer with red borders. DIAGNOSIS  Your caregiver can diagnose simple oral ulcers by examination. Additional testing is usually not required.  TREATMENT  Treatment is aimed at pain relief. Generally, oral ulcers resolve by themselves within 1 to 2 weeks without medication and are not contagious unless caused by herpes (and other viruses). Antibiotics are not effective with mouth sores. Avoid direct contact with others until the ulcer is completely healed. See your caregiver for follow-up care as recommended. Also:  Offer a soft diet.  Encourage plenty of fluids to prevent dehydration. Popsicles and milk shakes can be helpful.  Avoid acidic and salty foods and drinks such as orange juice.  Infants and young children will often refuse to drink because of pain. Using a teaspoon, cup, or syringe to give small amounts of fluids frequently can help prevent dehydration.  Cold compresses on the face may help reduce pain.  Pain medication can help control soreness.  A solution of diphenhydramine  mixed with a liquid antacid can be useful to decrease the soreness of ulcers. Consult a caregiver for the dosing.  Liquids or ointments with a numbing ingredient may be helpful when used as recommended.  Older children and teenagers can rinse their mouth with a salt-water mixture (1/2 teaspoon of salt in 8 ounces of water) four times a day. This treatment is uncomfortable but may reduce the time the ulcers are present.  There are many over-the-counter throat lozenges and medications available for oral ulcers. Their effectiveness has not been studied.  Consult your medical caregiver prior to using homeopathic treatments for oral ulcers. SEEK MEDICAL CARE IF:   You think your child needs to be seen.  The pain worsens and you cannot control it.  There are 4 or more ulcers.  The lips and gums begin to bleed and crust.  A single mouth ulcer is near a tooth that is causing a toothache or pain.  Your child has a fever, swollen face, or swollen glands.  The ulcers began after starting a medication.  Mouth ulcers keep reoccurring or last more than 2 weeks.  You think your child is not taking adequate fluids. SEEK IMMEDIATE MEDICAL CARE IF:   Your child has a high fever.  Your child is unable to swallow or becomes dehydrated.  Your child looks or acts very ill.  An ulcer caused by a chemical your child accidentally put in their mouth. Document Released: 02/02/2004 Document Revised: 05/11/2013 Document Reviewed: 09/16/2008 Exit[employer] Patient Information 2015 [employer]. This information is not intended to replace advice   given to you by your health care provider. Make sure you discuss any questions you have with your health care provider.  

## 2013-09-17 NOTE — Progress Notes (Signed)
Subjective:    Patient ID: Kyle Robinson, male    DOB: June 11, 1960, 53 y.o.   MRN: [id number]  HPI  Pt presents to the clinic today with c/o a spot on his throat. He noticed this about 2 weeks ago. He denies pain or difficulty swallowing. He denies fever, chills or body aches. He did try to scrap it off with a toothbrush and Qtip with no avail. He does have a history of cold sores but he reports those are usually painful. He did have a tonsillar tag removed a few years ago.  Review of Systems  Past Medical History  Diagnosis Date  . GERD (gastroesophageal reflux disease)   . IBS (irritable bowel syndrome)   . Arthritis   . Tonsillar tag     12/09 Left tonsillar tag/polyp  removed [name])  . Hyperlipidemia     Current Outpatient Prescriptions  Medication Sig Dispense Refill  . amitriptyline (ELAVIL) 25 MG tablet TAKE 1 TO 2 TABLETS BY MOUTH AT BEDTIME AS NEEDED  60 tablet  7  . amoxicillin-clavulanate (AUGMENTIN) 875-125 MG per tablet Take 1 tablet by mouth 2 (two) times daily.  20 tablet  0  . docusate sodium (COLACE) 100 MG capsule Take 100 mg by mouth 2 (two) times daily.        . hydrocortisone (ANUSOL-HC) 25 MG suppository UNWRAP AND INSERT 1 SUPPOSITORY RECTALLY TWICE DAILY AS NEEDED FOR HEMMRHOIDS  30 suppository  1  . ranitidine (ZANTAC) 150 MG tablet Take 150 mg by mouth daily.         No current facility-administered medications for this visit.    Allergies  Allergen Reactions  . Ibuprofen   . Sulfonamide Derivatives     Family History  Problem Relation Age of Onset  . Coronary artery disease Father   . Coronary artery disease Sister   . Coronary artery disease Brother   . Hypertension Neg Hx   . Diabetes Neg Hx     History   Social History  . Marital Status: Married    Spouse Name: N/A    Number of Children: 2  . Years of Education: N/A   Occupational History  . 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 Smoking status:  Never Smoker   . Smokeless tobacco: Never Used  . Alcohol Use: No  . Drug Use: No  . Sexual Activity: Not on file   Other Topics Concern  . Not on file   Social History Narrative  . No narrative on file     Constitutional: Denies fever, malaise, fatigue, headache or abrupt weight changes.  HEENT: Pt reports lesions in mouth. Denies eye pain, eye redness, ear pain, ringing in the ears, wax buildup, runny nose, nasal congestion, bloody nose, or sore throat. Respiratory: Denies difficulty breathing, shortness of breath, cough or sputum production.     No other specific complaints in a complete review of systems (except as listed in HPI above).     Objective:   Physical Exam   BP 124/82  Pulse 74  Temp(Src) 97.8 F (36.6 C) (Oral)  Wt 223 lb 8 oz (101.379 kg)  SpO2 98% Wt Readings from Last 3 Encounters:  09/17/13 223 lb 8 oz (101.379 kg)  01/30/13 233 lb 8 oz (105.915 kg)  07/28/12 227 lb 4 oz (103.08 kg)    General: Appears his stated age, well developed, well nourished in NAD. HEENT: Head: normal shape and size; Throat/Mouth: Teeth present,  mucosa pink and moist, no exudate noted. Small < 1cm round blister noted on left side of posterior oropharynx. Cardiovascular: Normal rate and rhythm. S1,S2 noted.  No murmur, rubs or gallops noted.  Pulmonary/Chest: Normal effort and positive vesicular breath sounds. No respiratory distress. No wheezes, rales or ronchi noted.   BMET    Component Value Date/Time   NA 141 03/12/2012 1031   K 4.7 03/12/2012 1031   CL 106 03/12/2012 1031   CO2 30 03/12/2012 1031   GLUCOSE 90 03/12/2012 1031   BUN 21 03/12/2012 1031   CREATININE 1.1 03/12/2012 1031   CALCIUM 9.4 03/12/2012 1031    Lipid Panel     Component Value Date/Time   CHOL 198 03/12/2012 1031   TRIG 125.0 03/12/2012 1031   HDL 29.10* 03/12/2012 1031   CHOLHDL 7 03/12/2012 1031   VLDL 25.0 03/12/2012 1031   LDLCALC 144* 03/12/2012 1031    CBC    Component Value Date/Time   WBC 4.6  03/12/2012 1031   RBC 5.43 03/12/2012 1031   HGB 16.0 03/12/2012 1031   HCT 47.3 03/12/2012 1031   PLT 263.0 03/12/2012 1031   MCV 87.2 03/12/2012 1031   MCHC 33.8 03/12/2012 1031   RDW 11.7 03/12/2012 1031   LYMPHSABS 1.6 03/12/2012 1031   MONOABS 0.4 03/12/2012 1031   EOSABS 0.1 03/12/2012 1031   BASOSABS 0.0 03/12/2012 1031    Hgb A1C No results found for this basename: HGBA1C        Assessment & Plan:   Lesion of oropharynx:  It is filled with fluid, hard to tell if it is clear or infected Will treat with Amoxil BID x 10 days If no improvement, will refer to ENT to see if they would lance it  RTC as needed or if symptoms persist or worsen

## 2013-09-17 NOTE — Progress Notes (Signed)
Pre visit review using our clinic review tool, if applicable. No additional management support is needed unless otherwise documented below in the visit note. 

## 2013-12-21 ENCOUNTER — Other Ambulatory Visit: Payer: Self-pay | Admitting: Internal Medicine

## 2014-02-22 ENCOUNTER — Other Ambulatory Visit: Payer: Self-pay | Admitting: Internal Medicine

## 2014-02-22 NOTE — Telephone Encounter (Signed)
Last office visit 09/17/2013.  Last refilled 12/21/2013 for #60 with 1 refill.  Ok to refill?

## 2014-02-23 NOTE — Telephone Encounter (Signed)
Approved: okay to refill for a year 

## 2014-03-30 ENCOUNTER — Ambulatory Visit (INDEPENDENT_AMBULATORY_CARE_PROVIDER_SITE_OTHER): Payer: 59 | Admitting: Internal Medicine

## 2014-03-30 ENCOUNTER — Encounter: Payer: Self-pay | Admitting: Internal Medicine

## 2014-03-30 VITALS — BP 110/70 | HR 89 | Temp 97.8°F | Wt 226.0 lb

## 2014-03-30 DIAGNOSIS — N410 Acute prostatitis: Secondary | ICD-10-CM

## 2014-03-30 DIAGNOSIS — N390 Urinary tract infection, site not specified: Secondary | ICD-10-CM

## 2014-03-30 DIAGNOSIS — N411 Chronic prostatitis: Secondary | ICD-10-CM | POA: Insufficient documentation

## 2014-03-30 LAB — POCT URINALYSIS DIPSTICK
Bilirubin, UA: NEGATIVE
Blood, UA: NEGATIVE
GLUCOSE UA: NEGATIVE
Ketones, UA: NEGATIVE
Leukocytes, UA: NEGATIVE
NITRITE UA: NEGATIVE
PROTEIN UA: NEGATIVE
Spec Grav, UA: 1.015
UROBILINOGEN UA: 0.2
pH, UA: 6.5

## 2014-03-30 MED ORDER — TAMSULOSIN HCL 0.4 MG PO CAPS: 0.4000 mg | ORAL_CAPSULE | Freq: Every day | ORAL | Status: DC

## 2014-03-30 MED ORDER — CIPROFLOXACIN HCL 500 MG PO TABS: 500.0000 mg | ORAL_TABLET | Freq: Two times a day (BID) | ORAL | Status: DC
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
## 2014-03-30 NOTE — Progress Notes (Signed)
Pre visit review using our clinic review tool, if applicable. No additional management support is needed unless otherwise documented below in the visit note. 

## 2014-03-30 NOTE — Assessment & Plan Note (Signed)
Fairly classic presentation  He has had this in distant past ---known long standing enlarged prostate Will treat with cipro for 3-6 weeks tamsulosin for now---and then may continue or go off when symptoms gone

## 2014-03-30 NOTE — Patient Instructions (Signed)
Please take the cipro for at least 3 weeks---if your symptoms are still present after 1-2 weeks, take it for the full 6 weeks. Use the tamsulosin over the first month. It may be helpful to stay on it---but you can stop it after a month if your symptoms are completely gone.

## 2014-10-27 ENCOUNTER — Encounter: Payer: 59 | Admitting: Internal Medicine

## 2015-03-08 ENCOUNTER — Other Ambulatory Visit: Payer: Self-pay | Admitting: Internal Medicine

## 2015-04-02 ENCOUNTER — Other Ambulatory Visit: Payer: Self-pay | Admitting: Internal Medicine

## 2015-09-16 ENCOUNTER — Other Ambulatory Visit: Payer: Self-pay | Admitting: Internal Medicine

## 2015-09-23 ENCOUNTER — Encounter: Payer: 59 | Admitting: Internal Medicine

## 2015-10-12 ENCOUNTER — Ambulatory Visit (INDEPENDENT_AMBULATORY_CARE_PROVIDER_SITE_OTHER): Payer: BLUE CROSS/BLUE SHIELD | Admitting: Internal Medicine

## 2015-10-12 ENCOUNTER — Encounter: Payer: Self-pay | Admitting: Internal Medicine

## 2015-10-12 DIAGNOSIS — N410 Acute prostatitis: Secondary | ICD-10-CM | POA: Diagnosis not present

## 2015-10-12 LAB — POC URINALSYSI DIPSTICK (AUTOMATED)
Bilirubin, UA: NEGATIVE
Glucose, UA: NEGATIVE
Ketones, UA: NEGATIVE
Leukocytes, UA: NEGATIVE
Nitrite, UA: NEGATIVE
PROTEIN UA: NEGATIVE
RBC UA: NEGATIVE
SPEC GRAV UA: 1.025
UROBILINOGEN UA: 0.2
pH, UA: 6.5

## 2015-10-12 MED ORDER — CIPROFLOXACIN HCL 500 MG PO TABS: 500.0000 mg | ORAL_TABLET | Freq: Two times a day (BID) | ORAL | 1 refills | Status: DC
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
## 2015-10-12 NOTE — Addendum Note (Signed)
Addended by: Pilar Grammes on: 10/12/2015 11:47 AM   Modules accepted: Orders

## 2015-10-12 NOTE — Progress Notes (Signed)
Pre visit review using our clinic review tool, if applicable. No additional management support is needed unless otherwise documented below in the visit note. 

## 2015-10-12 NOTE — Assessment & Plan Note (Signed)
His typical symptoms  Has stayed on the tamsulosin Will use the cipro again

## 2015-10-13 ENCOUNTER — Telehealth: Payer: Self-pay | Admitting: Internal Medicine

## 2015-10-13 ENCOUNTER — Ambulatory Visit (INDEPENDENT_AMBULATORY_CARE_PROVIDER_SITE_OTHER): Payer: BLUE CROSS/BLUE SHIELD | Admitting: Primary Care

## 2015-10-13 VITALS — BP 144/82 | HR 92 | Temp 98.0°F | Wt 216.8 lb

## 2015-10-13 DIAGNOSIS — J3489 Other specified disorders of nose and nasal sinuses: Secondary | ICD-10-CM | POA: Diagnosis not present

## 2015-10-13 NOTE — Telephone Encounter (Signed)
Pt has appt 10/13/15 at 2:45 with Allie Bossier NP.

## 2015-10-13 NOTE — Telephone Encounter (Signed)
Noted and evaluated. 

## 2015-10-13 NOTE — Telephone Encounter (Signed)
Patient Name: Kyle Robinson  DOB: 09/03/1960    Initial Comment [name] says, head cold for a week, cough, w/ congestion. When he walks or shakes or nods his head, he feels a sensation in his upper teeth. This started prior to starting Cipro yesterday.    Nurse Assessment  Nurse: Mallie Mussel, RN, Alveta Heimlich Date/Time Eilene Ghazi Time): 10/13/2015 11:48:04 AM  Confirm and document reason for call. If symptomatic, describe symptoms. You must click the next button to save text entered. ---[name] states that he has had a head cold for a week with a cough and congestion. As he walks, turns his head or nods, he has a weird sensation in his upper teeth, but not pain. He is driving now, but will help with questions as much as possible. He does not think he has puffiness of his face nor is the skin red. He denies pain. He has clear drainage from his nose. It has been a yellowish color. Denies fever. He was seen yesterday for prostate issues and was prescribed Cipro. Wants to know if Cipro will take care of this. He did not mention these symptoms to the doctor yesterday.  Has the patient traveled out of the country within the last 30 days? ---No  Does the patient have any new or worsening symptoms? ---Yes  Will a triage be completed? ---Yes  Related visit to physician within the last 2 weeks? ---No  Does the PT have any chronic conditions? (i.e. diabetes, asthma, etc.) ---No  Is this a behavioral health or substance abuse call? ---No     Guidelines    Guideline Title Affirmed Question Affirmed Notes  Sinus Pain or Congestion [1] Sinus congestion as part of a cold AND [2] present < 10 days (all triage questions negative)    Final Disposition User   See Physician within 24 Hours Mallie Mussel, RN, Alveta Heimlich    Comments  He states that his coughing has almost stopped.  [name] requested to be seen today. Dr. Silvio Pate did not have any openings today. I was able to schedule him to be seen by Alma Friendly NP today at 2:45pm.  From my  own past experience, his symptoms with his teeth could indicate a sinus infection.   Referrals  REFERRED TO PCP OFFICE   Disagree/Comply: Comply

## 2015-10-13 NOTE — Progress Notes (Signed)
jotherPre visit review using our clinic review tool, if applicable. No additional management support is needed unless otherwise documented below in the visit note.

## 2015-10-13 NOTE — Patient Instructions (Signed)
Try using Flonase (fluticasone) nasal spray. Instill 1 spray in each nostril twice daily.   Continue the Cipro antibiotics as prescribed.  Start taking a daily antihistamine such as Claritin, Zyrtec, or Allegra.  Ensure you are staying hydrated with water.  It was a pleasure meeting you!
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
## 2015-10-25 ENCOUNTER — Other Ambulatory Visit: Payer: Self-pay | Admitting: Internal Medicine

## 2015-10-27 ENCOUNTER — Encounter: Payer: Self-pay | Admitting: Internal Medicine

## 2015-10-27 ENCOUNTER — Ambulatory Visit (INDEPENDENT_AMBULATORY_CARE_PROVIDER_SITE_OTHER): Payer: BLUE CROSS/BLUE SHIELD | Admitting: Internal Medicine

## 2015-10-27 VITALS — BP 126/90 | HR 94 | Temp 98.0°F | Ht 72.25 in | Wt 213.0 lb

## 2015-10-27 DIAGNOSIS — M5416 Radiculopathy, lumbar region: Secondary | ICD-10-CM

## 2015-10-27 DIAGNOSIS — Z1211 Encounter for screening for malignant neoplasm of colon: Secondary | ICD-10-CM

## 2015-10-27 DIAGNOSIS — G8929 Other chronic pain: Secondary | ICD-10-CM

## 2015-10-27 DIAGNOSIS — K219 Gastro-esophageal reflux disease without esophagitis: Secondary | ICD-10-CM

## 2015-10-27 DIAGNOSIS — Z0000 Encounter for general adult medical examination without abnormal findings: Secondary | ICD-10-CM

## 2015-10-27 MED ORDER — NABUMETONE 500 MG PO TABS: 500.0000 mg | ORAL_TABLET | Freq: Two times a day (BID) | ORAL | 5 refills | Status: DC | PRN

## 2015-10-27 MED ORDER — TAMSULOSIN HCL 0.4 MG PO CAPS: 0.4000 mg | ORAL_CAPSULE | Freq: Every day | ORAL | 3 refills | Status: DC

## 2015-10-27 MED ORDER — CYCLOBENZAPRINE HCL 10 MG PO TABS: 5.0000 mg | ORAL_TABLET | Freq: Every evening | ORAL | 2 refills | Status: DC | PRN

## 2015-10-27 NOTE — Assessment & Plan Note (Signed)
Doing okay with ranitidine

## 2015-10-27 NOTE — Assessment & Plan Note (Signed)
Stable Discussed core strengthening
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
## 2015-10-27 NOTE — Patient Instructions (Signed)
Consider trying melatonin 3-6 mg at bedtime to help sleep.

## 2015-10-27 NOTE — Progress Notes (Signed)
Pre visit review using our clinic review tool, if applicable. No additional management support is needed unless otherwise documented below in the visit note. 

## 2015-10-27 NOTE — Assessment & Plan Note (Signed)
Due for colon ---will set up No PSA with ongoing prostatitis Prefers no flu vaccine Counseled on stress reduction

## 2015-10-28 ENCOUNTER — Encounter: Payer: Self-pay | Admitting: *Deleted

## 2015-10-28 LAB — CBC WITH DIFFERENTIAL/PLATELET
BASOS PCT: 0.6 % (ref 0.0–3.0)
Basophils Absolute: 0 10*3/uL (ref 0.0–0.1)
EOS PCT: 0.3 % (ref 0.0–5.0)
Eosinophils Absolute: 0 10*3/uL (ref 0.0–0.7)
HCT: 47.7 % (ref 39.0–52.0)
HEMOGLOBIN: 16.4 g/dL (ref 13.0–17.0)
LYMPHS ABS: 1.2 10*3/uL (ref 0.7–4.0)
Lymphocytes Relative: 17.2 % (ref 12.0–46.0)
MCHC: 34.3 g/dL (ref 30.0–36.0)
MCV: 88.3 fl (ref 78.0–100.0)
MONO ABS: 0.5 10*3/uL (ref 0.1–1.0)
Monocytes Relative: 7.5 % (ref 3.0–12.0)
NEUTROS ABS: 5.4 10*3/uL (ref 1.4–7.7)
NEUTROS PCT: 74.4 % (ref 43.0–77.0)
PLATELETS: 299 10*3/uL (ref 150.0–400.0)
RBC: 5.4 Mil/uL (ref 4.22–5.81)
RDW: 12.5 % (ref 11.5–15.5)
WBC: 7.2 10*3/uL (ref 4.0–10.5)

## 2015-10-28 LAB — COMPREHENSIVE METABOLIC PANEL
ALT: 19 U/L (ref 0–53)
AST: 14 U/L (ref 0–37)
Albumin: 4.9 g/dL (ref 3.5–5.2)
Alkaline Phosphatase: 54 U/L (ref 39–117)
BUN: 19 mg/dL (ref 6–23)
CHLORIDE: 105 meq/L (ref 96–112)
CO2: 29 meq/L (ref 19–32)
CREATININE: 1.02 mg/dL (ref 0.40–1.50)
Calcium: 9.9 mg/dL (ref 8.4–10.5)
GFR: 80.76 mL/min (ref >60.00)
GLUCOSE: 82 mg/dL (ref 70–99)
POTASSIUM: 4.1 meq/L (ref 3.5–5.1)
SODIUM: 142 meq/L (ref 135–145)
Total Bilirubin: 1.2 mg/dL (ref 0.2–1.2)
Total Protein: 7.4 g/dL (ref 6.0–8.3)

## 2015-11-14 ENCOUNTER — Encounter: Payer: Self-pay | Admitting: General Surgery

## 2015-11-14 ENCOUNTER — Ambulatory Visit (INDEPENDENT_AMBULATORY_CARE_PROVIDER_SITE_OTHER): Payer: BLUE CROSS/BLUE SHIELD | Admitting: General Surgery

## 2015-11-14 VITALS — BP 136/74 | HR 86 | Resp 12 | Ht 73.0 in | Wt 218.0 lb

## 2015-11-14 DIAGNOSIS — Z1211 Encounter for screening for malignant neoplasm of colon: Secondary | ICD-10-CM

## 2015-11-14 MED ORDER — POLYETHYLENE GLYCOL 3350 17 GM/SCOOP PO POWD: ORAL | 0 refills | Status: DC
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
## 2015-11-14 NOTE — Patient Instructions (Signed)
Colonoscopy A colonoscopy is an exam to look at the entire large intestine (colon). This exam can help find problems such as tumors, polyps, inflammation, and areas of bleeding. The exam takes about 1 hour.  LET YOUR HEALTH CARE PROVIDER KNOW ABOUT:   Any allergies you have.  All medicines you are taking, including vitamins, herbs, eye drops, creams, and over-the-counter medicines.  Previous problems you or members of your family have had with the use of anesthetics.  Any blood disorders you have.  Previous surgeries you have had.  Medical conditions you have. RISKS AND COMPLICATIONS  Generally, this is a safe procedure. However, as with any procedure, complications can occur. Possible complications include:  Bleeding.  Tearing or rupture of the colon wall.  Reaction to medicines given during the exam.  Infection (rare). BEFORE THE PROCEDURE   Ask your health care provider about changing or stopping your regular medicines.  You may be prescribed an oral bowel prep. This involves drinking a large amount of medicated liquid, starting the day before your procedure. The liquid will cause you to have multiple loose stools until your stool is almost clear or light green. This cleans out your colon in preparation for the procedure.  Do not eat or drink anything else once you have started the bowel prep, unless your health care provider tells you it is safe to do so.  Arrange for someone to drive you home after the procedure. PROCEDURE   You will be given medicine to help you relax (sedative).  You will lie on your side with your knees bent.  A long, flexible tube with a light and camera on the end (colonoscope) will be inserted through the rectum and into the colon. The camera sends video back to a computer screen as it moves through the colon. The colonoscope also releases carbon dioxide gas to inflate the colon. This helps your health care provider see the area better.  During  the exam, your health care provider may take a small tissue sample (biopsy) to be examined under a microscope if any abnormalities are found.  The exam is finished when the entire colon has been viewed. AFTER THE PROCEDURE   Do not drive for 24 hours after the exam.  You may have a small amount of blood in your stool.  You may pass moderate amounts of gas and have mild abdominal cramping or bloating. This is caused by the gas used to inflate your colon during the exam.  Ask when your test results will be ready and how you will get your results. Make sure you get your test results.   This information is not intended to replace advice given to you by your health care provider. Make sure you discuss any questions you have with your health care provider.   Document Released: 12/23/1999 Document Revised: 10/15/2012 Document Reviewed: 09/01/2012 Elsevier Interactive Patient Education 2016 [employer] Inc[employer]  

## 2015-12-06 ENCOUNTER — Ambulatory Visit (INDEPENDENT_AMBULATORY_CARE_PROVIDER_SITE_OTHER): Payer: BLUE CROSS/BLUE SHIELD | Admitting: Internal Medicine

## 2015-12-06 ENCOUNTER — Encounter: Payer: Self-pay | Admitting: Internal Medicine

## 2015-12-06 VITALS — BP 124/86 | HR 90 | Temp 98.0°F | Wt 219.0 lb

## 2015-12-06 DIAGNOSIS — M545 Low back pain, unspecified: Secondary | ICD-10-CM

## 2015-12-06 DIAGNOSIS — M5442 Lumbago with sciatica, left side: Secondary | ICD-10-CM | POA: Insufficient documentation
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
## 2015-12-06 NOTE — Assessment & Plan Note (Signed)
Doesn't seem to have radicular pain Mostly muscle by exam--though flexeril doesn't help Discussed better stretching--to limit pulling Pain with standing and radiation to both groins is concerning for early spinal stenosis He will resume relafen after colonoscopy If worsens, he will go back to Dr Brien Few

## 2015-12-06 NOTE — Progress Notes (Signed)
Pre visit review using our clinic review tool, if applicable. No additional management support is needed unless otherwise documented below in the visit note. 

## 2015-12-12 ENCOUNTER — Other Ambulatory Visit: Payer: Self-pay | Admitting: General Surgery

## 2015-12-13 ENCOUNTER — Encounter: Payer: Self-pay | Admitting: *Deleted

## 2015-12-14 ENCOUNTER — Ambulatory Visit: Payer: BLUE CROSS/BLUE SHIELD | Admitting: Anesthesiology

## 2015-12-14 ENCOUNTER — Encounter: Payer: Self-pay | Admitting: Anesthesiology

## 2015-12-14 ENCOUNTER — Ambulatory Visit
Admission: RE | Admit: 2015-12-14 | Discharge: 2015-12-14 | Disposition: A | Discharge status: Home / Self Care | Payer: BLUE CROSS/BLUE SHIELD | Source: Ambulatory Visit | Attending: General Surgery | Admitting: General Surgery

## 2015-12-14 ENCOUNTER — Encounter
Admission: RE | Disposition: A | Discharge status: Home / Self Care | Payer: Self-pay | Source: Ambulatory Visit | Attending: General Surgery

## 2015-12-14 DIAGNOSIS — Z882 Allergy status to sulfonamides status: Secondary | ICD-10-CM | POA: Diagnosis not present

## 2015-12-14 DIAGNOSIS — K219 Gastro-esophageal reflux disease without esophagitis: Secondary | ICD-10-CM | POA: Insufficient documentation

## 2015-12-14 DIAGNOSIS — K5730 Diverticulosis of large intestine without perforation or abscess without bleeding: Secondary | ICD-10-CM | POA: Insufficient documentation

## 2015-12-14 DIAGNOSIS — E785 Hyperlipidemia, unspecified: Secondary | ICD-10-CM | POA: Insufficient documentation

## 2015-12-14 DIAGNOSIS — Z1211 Encounter for screening for malignant neoplasm of colon: Secondary | ICD-10-CM | POA: Diagnosis not present

## 2015-12-14 DIAGNOSIS — K589 Irritable bowel syndrome without diarrhea: Secondary | ICD-10-CM | POA: Diagnosis not present

## 2015-12-14 HISTORY — PX: COLONOSCOPY WITH PROPOFOL: SHX5780

## 2015-12-14 SURGERY — COLONOSCOPY WITH PROPOFOL
Anesthesia: General

## 2015-12-14 MED ORDER — SODIUM CHLORIDE 0.9 % IV SOLN
INTRAVENOUS | Status: DC
  Administered 2015-12-14: 1000 mL via INTRAVENOUS
  Administered 2015-12-14: 09:00:00 via INTRAVENOUS

## 2015-12-14 MED ORDER — PROPOFOL 500 MG/50ML IV EMUL
INTRAVENOUS | Status: DC | PRN
  Administered 2015-12-14: 180 ug/kg/min via INTRAVENOUS

## 2015-12-14 MED ORDER — FENTANYL CITRATE (PF) 100 MCG/2ML IJ SOLN
INTRAMUSCULAR | Status: DC | PRN
  Administered 2015-12-14: 50 ug via INTRAVENOUS

## 2015-12-14 MED ORDER — LIDOCAINE HCL (CARDIAC) 20 MG/ML IV SOLN
INTRAVENOUS | Status: DC | PRN
  Administered 2015-12-14: 40 mg via INTRATRACHEAL

## 2015-12-14 MED ORDER — MIDAZOLAM HCL 2 MG/2ML IJ SOLN
INTRAMUSCULAR | Status: DC | PRN
Start: 2015-12-14 — End: 2015-12-14
  Administered 2015-12-14: 2 mg via INTRAVENOUS

## 2015-12-14 MED ORDER — PROPOFOL 10 MG/ML IV BOLUS
INTRAVENOUS | Status: DC | PRN
  Administered 2015-12-14: 50 mg via INTRAVENOUS
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
## 2015-12-14 NOTE — Anesthesia Postprocedure Evaluation (Signed)
Anesthesia Post Note  Patient: Kyle Robinson  Procedure(s) Performed: Procedure(s) (LRB): COLONOSCOPY WITH PROPOFOL (N/A)  Patient location during evaluation: Endoscopy Anesthesia Type: General Level of consciousness: awake and alert Pain management: pain level controlled Vital Signs Assessment: post-procedure vital signs reviewed and stable Respiratory status: spontaneous breathing, nonlabored ventilation, respiratory function stable and patient connected to nasal cannula oxygen Cardiovascular status: blood pressure returned to baseline and stable Postop Assessment: no signs of nausea or vomiting Anesthetic complications: no    Last Vitals:  Vitals:   12/14/15 0930 12/14/15 0940  BP: 123/79 118/75  Pulse:  74  Resp:  11  Temp:      Last Pain:  Vitals:   12/14/15 0910  TempSrc: Tympanic                 Martha Clan

## 2015-12-14 NOTE — Anesthesia Preprocedure Evaluation (Signed)
Anesthesia Evaluation  Patient identified by MRN, date of birth, ID band Patient awake    Reviewed: Allergy & Precautions, H&P , NPO status , Patient's Chart, lab work & pertinent test results, reviewed documented beta blocker date and time   History of Anesthesia Complications Negative for: history of anesthetic complications  Airway Mallampati: I  TM Distance: >3 FB Neck ROM: full    Dental no notable dental hx. (+) Caps, Missing   Pulmonary neg pulmonary ROS,    Pulmonary exam normal breath sounds clear to auscultation       Cardiovascular Exercise Tolerance: Good negative cardio ROS Normal cardiovascular exam Rhythm:regular Rate:Normal     Neuro/Psych negative neurological ROS  negative psych ROS   GI/Hepatic Neg liver ROS, GERD  ,  Endo/Other  negative endocrine ROS  Renal/GU negative Renal ROS  negative genitourinary   Musculoskeletal   Abdominal   Peds  Hematology negative hematology ROS (+)   Anesthesia Other Findings Past Medical History: No date: Arthritis No date: GERD (gastroesophageal reflux disease) No date: Hyperlipidemia No date: IBS (irritable bowel syndrome) No date: IBS (irritable bowel syndrome) No date: Tonsillar tag     Comment: 12/09 Left tonsillar tag/polyp  removed               [name])   Reproductive/Obstetrics negative OB ROS                             Anesthesia Physical Anesthesia Plan  ASA: II  Anesthesia Plan: General   Post-op Pain Management:    Induction:   Airway Management Planned:   Additional Equipment:   Intra-op Plan:   Post-operative Plan:   Informed Consent: I have reviewed the patients History and Physical, chart, labs and discussed the procedure including the risks, benefits and alternatives for the proposed anesthesia with the patient or authorized representative who has indicated his/her understanding and acceptance.    Dental Advisory Given  Plan Discussed with: Anesthesiologist, CRNA and Surgeon  Anesthesia Plan Comments:         Anesthesia Quick Evaluation

## 2015-12-14 NOTE — Op Note (Signed)
[facility]astro[facility] Patient Name: Kyle Robinson Procedure Date: 12/14/2015 8:44 AM MRN: [id number] Account #: [id number] Date of Birth: [birth year] Admit Type: Outpatient Age: 54 Room: [facility] ENDO ROOM 1 Gender: Male Note Status: Finalized Procedure:            Colonoscopy Indications:          Screening for colorectal malignant neoplasm Providers:            Kharma Sampsel G. Jamal Collin, MD Referring MD:         Venia Carbon (Referring MD) Medicines:            General Anesthesia Complications:        No immediate complications. Procedure:            Pre-Anesthesia Assessment:                       - Using IV propofol under the supervision of an                        anesthesiologist was determined to be medically                        necessary for this procedure based on review of the                        patient's medical history, medications, and prior                        anesthesia history.                       After obtaining informed consent, the colonoscope was                        passed under direct vision. Throughout the procedure,                        the patient's blood pressure, pulse, and oxygen                        saturations were monitored continuously. The                        Colonoscope was introduced through the anus and                        advanced to the the cecum, identified by appendiceal                        orifice and ileocecal valve. The colonoscopy was                        performed without difficulty. The patient tolerated the                        procedure well. The quality of the bowel preparation                        was excellent. Findings:      The perianal and digital rectal examinations were normal.      A few small-mouthed diverticula were  found in the proximal sigmoid colon.      The exam was otherwise without abnormality on direct and retroflexion       views. Impression:           -  Diverticulosis in the proximal sigmoid colon.                       - The examination was otherwise normal on direct and                        retroflexion views.                       - No specimens collected. Recommendation:       - Discharge patient to home.                       - Resume previous diet.                       - Continue present medications.                       - Repeat colonoscopy in 10 years for screening purposes. Procedure Code(s):    --- Professional ---                       [phone], Colonoscopy, flexible; diagnostic, including                        collection of specimen(s) by brushing or washing, when                        performed (separate procedure) Diagnosis Code(s):    --- Professional ---                       Z12.11, Encounter for screening for malignant neoplasm                        of colon                       K57.30, Diverticulosis of large intestine without                        perforation or abscess without bleeding CPT copyright 2016 American Medical Association. All rights reserved. The codes documented in this report are preliminary and upon coder review may  be revised to meet current compliance requirements. Christene Lye, MD 12/14/2015 9:09:25 AM This report has been signed electronically. Number of Addenda: 0 Note Initiated On: 12/14/2015 8:44 AM Scope Withdrawal Time: 0 hours 9 minutes 28 seconds  Total Procedure Duration: 0 hours 16 minutes 13 seconds       [facility]

## 2015-12-14 NOTE — Transfer of Care (Signed)
Immediate Anesthesia Transfer of Care Note  Patient: Kyle Robinson  Procedure(s) Performed: Procedure(s): COLONOSCOPY WITH PROPOFOL (N/A)  Patient Location: PACU  Anesthesia Type:General  Level of Consciousness: awake  Airway & Oxygen Therapy: Patient Spontanous Breathing and Patient connected to nasal cannula oxygen  Post-op Assessment: Report given to RN and Post -op Vital signs reviewed and stable  Post vital signs: Reviewed and stable  Last Vitals:  Vitals:   12/14/15 0812 12/14/15 0910  BP: 132/90   Pulse: 91 84  Resp: 16 16  Temp: 36.6 C 36.2 C    Last Pain:  Vitals:   12/14/15 0910  TempSrc: Tympanic         Complications: No apparent anesthesia complications

## 2015-12-14 NOTE — Interval H&P Note (Signed)
History and Physical Interval Note:  12/14/2015 8:33 AM  Kyle Robinson  has presented today for surgery, with the diagnosis of SCREENING  The various methods of treatment have been discussed with the patient and family. After consideration of risks, benefits and other options for treatment, the patient has consented to  Procedure(s): COLONOSCOPY WITH PROPOFOL (N/A) as a surgical intervention .  The patient's history has been reviewed, patient examined, no change in status, stable for surgery.  I have reviewed the patient's chart and labs.  Questions were answered to the patient's satisfaction.     [name]

## 2015-12-14 NOTE — Anesthesia Procedure Notes (Signed)
Date/Time: 12/14/2015 8:45 AM Performed by: Allean Found Pre-anesthesia Checklist: Patient identified, Emergency Drugs available, Suction available, Patient being monitored and Timeout performed Patient Re-evaluated:Patient Re-evaluated prior to inductionOxygen Delivery Method: Nasal cannula Intubation Type: IV induction

## 2015-12-15 ENCOUNTER — Encounter: Payer: Self-pay | Admitting: General Surgery

## 2016-03-14 ENCOUNTER — Other Ambulatory Visit: Payer: Self-pay | Admitting: Internal Medicine

## 2016-03-14 NOTE — Telephone Encounter (Signed)
Last f/u 10/2015-CPE

## 2016-04-20 ENCOUNTER — Other Ambulatory Visit: Payer: Self-pay | Admitting: Primary Care

## 2016-04-20 NOTE — Telephone Encounter (Signed)
Approved: okay for 1 year 

## 2016-04-20 NOTE — Telephone Encounter (Signed)
Last filled 03/16/16--please advise

## 2016-04-25 ENCOUNTER — Other Ambulatory Visit: Payer: Self-pay | Admitting: Primary Care

## 2016-05-19 ENCOUNTER — Other Ambulatory Visit: Payer: Self-pay | Admitting: Primary Care

## 2016-07-15 ENCOUNTER — Other Ambulatory Visit: Payer: Self-pay | Admitting: Internal Medicine

## 2016-07-17 NOTE — Telephone Encounter (Addendum)
This was not on med list anymore. He said  He takes it everyday. Not sure why it was not on the med list.

## 2016-08-17 DIAGNOSIS — J069 Acute upper respiratory infection, unspecified: Secondary | ICD-10-CM | POA: Diagnosis not present

## 2016-08-27 ENCOUNTER — Telehealth: Payer: Self-pay | Admitting: Internal Medicine

## 2016-08-27 NOTE — Telephone Encounter (Signed)
Pt has appt with Dr Deborra Medina on 08/28/16 at 9 AM.

## 2016-08-27 NOTE — Telephone Encounter (Signed)
[place] [facility]  Patient Name: Kyle Robinson  DOB: 1960-01-14    Initial Comment [name] states c/o bladder and prostate area pain that radiates to his groin area.   Nurse Assessment  Nurse: Harlow Mares, RN, Suanne Marker Date/Time (Eastern Time): 08/27/2016 1:37:44 PM  Confirm and document reason for call. If symptomatic, describe symptoms. ---[name] states c/o bladder and prostate area pain that radiates to his groin area. Reports that he has some problems urinating.  Does the patient have any new or worsening symptoms? ---Yes  Will a triage be completed? ---Yes  Related visit to physician within the last 2 weeks? ---No  Does the PT have any chronic conditions? (i.e. diabetes, asthma, etc.) ---Yes  List chronic conditions. ---enlarged prostate; IBS; GERD  Is this a behavioral health or substance abuse call? ---No     Guidelines    Guideline Title Affirmed Question Affirmed Notes  Scrotal Pain [1] Pain comes and goes (intermittent) AND [2] present > 24 hours    Final Disposition User   See Physician within [place], RN, Suanne Marker    Comments  MD appt scheduled at 9am 08/28/16 @ [facility] with Dr. Deborra Medina.   Referrals  REFERRED TO PCP OFFICE   Disagree/Comply: Comply

## 2016-08-28 ENCOUNTER — Encounter: Payer: Self-pay | Admitting: Family Medicine

## 2016-08-28 ENCOUNTER — Ambulatory Visit (INDEPENDENT_AMBULATORY_CARE_PROVIDER_SITE_OTHER): Payer: Commercial Managed Care - PPO | Admitting: Family Medicine

## 2016-08-28 VITALS — BP 110/70 | HR 86 | Temp 98.1°F | Wt 224.0 lb

## 2016-08-28 DIAGNOSIS — N410 Acute prostatitis: Secondary | ICD-10-CM

## 2016-08-28 MED ORDER — CIPROFLOXACIN HCL 500 MG PO TABS
500.0000 mg | ORAL_TABLET | Freq: Two times a day (BID) | ORAL | 1 refills | Status: DC
Start: 2016-08-28 — End: 2017-08-08

## 2016-08-28 NOTE — Patient Instructions (Signed)
Great to meet you.  Take cipro as directed.  Please update me tomorrow.

## 2016-08-28 NOTE — Assessment & Plan Note (Signed)
Likely prostatitis- pt deferred UA- he feels this is not a UTI. Since I am placing him on cipro which should treat most urinary pathogens, I did not press the issue. I did advise that if no improvement within 1 day, to call me. The patient indicates understanding of these issues and agrees with the plan.
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
## 2016-10-18 DIAGNOSIS — D2262 Melanocytic nevi of left upper limb, including shoulder: Secondary | ICD-10-CM | POA: Diagnosis not present

## 2016-10-18 DIAGNOSIS — L821 Other seborrheic keratosis: Secondary | ICD-10-CM | POA: Diagnosis not present

## 2016-10-18 DIAGNOSIS — L570 Actinic keratosis: Secondary | ICD-10-CM | POA: Diagnosis not present

## 2016-10-18 DIAGNOSIS — D225 Melanocytic nevi of trunk: Secondary | ICD-10-CM | POA: Diagnosis not present

## 2016-10-27 ENCOUNTER — Other Ambulatory Visit: Payer: Self-pay | Admitting: Internal Medicine

## 2016-11-15 DIAGNOSIS — J069 Acute upper respiratory infection, unspecified: Secondary | ICD-10-CM | POA: Diagnosis not present

## 2016-11-15 DIAGNOSIS — H6240 Otitis externa in other diseases classified elsewhere, unspecified ear: Secondary | ICD-10-CM | POA: Diagnosis not present

## 2016-11-15 DIAGNOSIS — H6983 Other specified disorders of Eustachian tube, bilateral: Secondary | ICD-10-CM | POA: Diagnosis not present

## 2017-06-08 DIAGNOSIS — S61411A Laceration without foreign body of right hand, initial encounter: Secondary | ICD-10-CM | POA: Diagnosis not present

## 2017-06-08 DIAGNOSIS — L089 Local infection of the skin and subcutaneous tissue, unspecified: Secondary | ICD-10-CM | POA: Diagnosis not present

## 2017-07-08 ENCOUNTER — Other Ambulatory Visit: Payer: Self-pay | Admitting: Internal Medicine

## 2017-07-08 MED ORDER — CYCLOBENZAPRINE HCL 10 MG PO TABS: ORAL_TABLET | ORAL | 1 refills | Status: DC

## 2017-07-29 ENCOUNTER — Ambulatory Visit: Payer: Commercial Managed Care - PPO | Admitting: Family Medicine

## 2017-07-29 DIAGNOSIS — M545 Low back pain, unspecified: Secondary | ICD-10-CM

## 2017-07-29 MED ORDER — PREDNISONE 5 MG PO TABS: ORAL_TABLET | ORAL | 0 refills | Status: DC

## 2017-07-29 MED ORDER — GABAPENTIN 100 MG PO CAPS: 100.0000 mg | ORAL_CAPSULE | Freq: Three times a day (TID) | ORAL | 3 refills | Status: DC

## 2017-07-29 NOTE — Patient Instructions (Signed)
Nice to meet you Please try the medications  Please try the exercises once your pain is less than 2/10  Please try heat or ice over the area. Please follow up with me if your pain doesn't improve.

## 2017-07-29 NOTE — Assessment & Plan Note (Signed)
Symptoms seem to be muscular in nature. He was working on his garage above his head and in extension. Not suggestive of radicular currently.  - prednisone  - continue flexeril  - counseled on HEP  - counseled on supportive care - if no improvement consider PT and injections.
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
## 2017-08-06 ENCOUNTER — Ambulatory Visit: Payer: Commercial Managed Care - PPO | Admitting: Family Medicine

## 2017-08-08 ENCOUNTER — Encounter: Payer: Self-pay | Admitting: Family Medicine

## 2017-08-08 ENCOUNTER — Ambulatory Visit (INDEPENDENT_AMBULATORY_CARE_PROVIDER_SITE_OTHER)
Admission: RE | Admit: 2017-08-08 | Discharge: 2017-08-08 | Disposition: A | Discharge status: Home / Self Care | Payer: Commercial Managed Care - PPO | Source: Ambulatory Visit | Attending: Family Medicine | Admitting: Family Medicine

## 2017-08-08 ENCOUNTER — Telehealth: Payer: Self-pay | Admitting: Family Medicine

## 2017-08-08 ENCOUNTER — Ambulatory Visit: Payer: Commercial Managed Care - PPO | Admitting: Family Medicine

## 2017-08-08 VITALS — BP 136/84 | HR 102 | Ht 73.0 in | Wt 229.0 lb

## 2017-08-08 DIAGNOSIS — M25552 Pain in left hip: Secondary | ICD-10-CM | POA: Diagnosis not present

## 2017-08-08 DIAGNOSIS — M47816 Spondylosis without myelopathy or radiculopathy, lumbar region: Secondary | ICD-10-CM | POA: Diagnosis not present

## 2017-08-08 DIAGNOSIS — M5442 Lumbago with sciatica, left side: Secondary | ICD-10-CM | POA: Diagnosis not present

## 2017-08-08 MED ORDER — TRAMADOL HCL 50 MG PO TABS: 50.0000 mg | ORAL_TABLET | Freq: Three times a day (TID) | ORAL | 0 refills | Status: DC | PRN

## 2017-08-08 NOTE — Telephone Encounter (Signed)
Pt given results per notes of Dr. Raeford Razor on 08/08/17. Pt verbalized understanding.
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
## 2017-08-08 NOTE — Patient Instructions (Addendum)
Good to see you  Please increase the gabapentin to 300 mg three times per day  Please try the tramadol if the pain is severe  Please try heat on the area  I will call you with the results from today  Please let me know if the pain doesn't improve

## 2017-08-08 NOTE — Telephone Encounter (Signed)
Copied from [place] [id number]. Topic: Quick Communication - See Telephone Encounter >> Aug 08, 2017  9:29 AM Bea Graff, NT wrote: CRM for notification. See Telephone encounter for: 08/08/17. Pt states that during his appt with Dr. Raeford Razor today it was mentioned that a new anti-inflammatory would be called in. Pt would like to see if this will be called in or if he should continue the one he is on?

## 2017-08-08 NOTE — Telephone Encounter (Signed)
Informed patient to continue taking Aleve as needed per Dr. Doristine Locks instructions. Patient verbalized understanding.

## 2017-08-08 NOTE — Telephone Encounter (Signed)
Left VM for patient. If he calls back please have him speak with a nurse/CMA and inform that his hip xrays look good. His back xrays show mild degenerative changes. The [facility] can report results to patient.   If any questions then please take the best time and phone number to call and I will try to call him back.   Rosemarie Ax, MD [facility] 08/08/2017, 5:19 PM

## 2017-08-08 NOTE — Assessment & Plan Note (Signed)
Having ongoing pain in the left lower back and radicular type on left thigh and hip. History of microdiscectomy on the L5 for right sided pain. Doesn't appear to be GT bursitis given exam. Was working overhead on garage door that incited his pain.  - increase gabapentin  - tramadol for severe pain  - lumbar and left hip xray  - if no improvement consider PT or MRI to consider epidurals.

## 2017-08-16 ENCOUNTER — Telehealth: Payer: Self-pay | Admitting: Internal Medicine

## 2017-08-16 MED ORDER — GABAPENTIN 300 MG PO CAPS: ORAL_CAPSULE | ORAL | 5 refills | Status: DC

## 2017-08-16 NOTE — Telephone Encounter (Signed)
Rx sent in ,patient notified. 

## 2017-08-16 NOTE — Telephone Encounter (Signed)
Copied from [place] [phone]. Topic: General - Other >> Aug 16, 2017 10:24 AM Lennox Solders wrote: Reason for CRM: pt is calling and needs new rx gabapentin 300 mg three times a day. [facility] on [address]. Pt said dr schmitz increase medication mg

## 2017-08-24 ENCOUNTER — Other Ambulatory Visit: Payer: Self-pay | Admitting: Internal Medicine

## 2017-08-26 NOTE — Telephone Encounter (Signed)
Approved:okay x 1 year Have him schedule PE in next 6 months

## 2017-08-26 NOTE — Telephone Encounter (Signed)
Pt's last CPE 10-27-15 Several acute OVs.  No future OV

## 2017-09-03 ENCOUNTER — Other Ambulatory Visit: Payer: Self-pay | Admitting: Internal Medicine

## 2017-09-03 NOTE — Telephone Encounter (Signed)
Approved: #30 x 1 

## 2017-09-25 ENCOUNTER — Encounter: Payer: Self-pay | Admitting: Internal Medicine

## 2017-09-25 ENCOUNTER — Ambulatory Visit: Payer: Commercial Managed Care - PPO | Admitting: Internal Medicine

## 2017-09-25 VITALS — BP 122/80 | HR 92 | Temp 97.9°F | Ht 73.0 in | Wt 228.0 lb

## 2017-09-25 DIAGNOSIS — Z23 Encounter for immunization: Secondary | ICD-10-CM

## 2017-09-25 DIAGNOSIS — M5442 Lumbago with sciatica, left side: Secondary | ICD-10-CM | POA: Diagnosis not present

## 2017-09-25 MED ORDER — TAMSULOSIN HCL 0.4 MG PO CAPS: 0.4000 mg | ORAL_CAPSULE | Freq: Every day | ORAL | 11 refills | Status: DC

## 2017-09-25 MED ORDER — AMITRIPTYLINE HCL 25 MG PO TABS: 25.0000 mg | ORAL_TABLET | Freq: Every evening | ORAL | 11 refills | Status: DC | PRN

## 2017-09-25 MED ORDER — TRAMADOL HCL 50 MG PO TABS: 50.0000 mg | ORAL_TABLET | Freq: Three times a day (TID) | ORAL | 0 refills | Status: DC | PRN

## 2017-09-25 MED ORDER — HYDROCORTISONE ACETATE 25 MG RE SUPP: RECTAL | 1 refills | Status: DC

## 2017-09-25 MED ORDER — TRIAMCINOLONE ACETONIDE 0.1 % EX CREA: 1.0000 "application " | TOPICAL_CREAM | Freq: Two times a day (BID) | CUTANEOUS | 1 refills | Status: DC | PRN

## 2017-09-25 NOTE — Patient Instructions (Signed)
You can wean off the gabapentin to see if it makes any difference.
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
## 2017-09-25 NOTE — Addendum Note (Signed)
Addended by: Viviana Simpler I on: 09/25/2017 11:44 AM   Modules accepted: Orders

## 2017-09-25 NOTE — Assessment & Plan Note (Signed)
This pain doesn't really seem radicular It seems to be related to the hip Continue the tramadol Probably can stop the gabapentin Ortho evaluation

## 2017-10-09 ENCOUNTER — Ambulatory Visit (INDEPENDENT_AMBULATORY_CARE_PROVIDER_SITE_OTHER): Payer: BLUE CROSS/BLUE SHIELD | Admitting: Orthopaedic Surgery

## 2017-10-09 ENCOUNTER — Encounter (INDEPENDENT_AMBULATORY_CARE_PROVIDER_SITE_OTHER): Payer: Self-pay | Admitting: Orthopaedic Surgery

## 2017-10-09 DIAGNOSIS — M5432 Sciatica, left side: Secondary | ICD-10-CM
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
## 2017-10-17 DIAGNOSIS — D225 Melanocytic nevi of trunk: Secondary | ICD-10-CM | POA: Diagnosis not present

## 2017-10-17 DIAGNOSIS — S30861A Insect bite (nonvenomous) of abdominal wall, initial encounter: Secondary | ICD-10-CM | POA: Diagnosis not present

## 2017-10-17 DIAGNOSIS — D2262 Melanocytic nevi of left upper limb, including shoulder: Secondary | ICD-10-CM | POA: Diagnosis not present

## 2017-10-22 DIAGNOSIS — R109 Unspecified abdominal pain: Secondary | ICD-10-CM | POA: Diagnosis not present

## 2017-10-22 DIAGNOSIS — N23 Unspecified renal colic: Secondary | ICD-10-CM | POA: Diagnosis not present

## 2017-11-13 ENCOUNTER — Telehealth: Payer: Self-pay | Admitting: Internal Medicine

## 2017-11-13 NOTE — Telephone Encounter (Signed)
Pt was seen at urgent care 2 wks ago, was told by urgent care doctore that he had blood in urine and  may be kidney stones. Pt was told if it didn't get any better to call his pcp for further testing and maybe get an xray to see where the kidney stone was located or to see what the pcp suggest the next step should be. Please advise pt.

## 2017-11-14 NOTE — Telephone Encounter (Signed)
Spoke to pt. He said he has an appt with [place] tomorrow at 10:15. Went to [facility] 10-22-17 and the UA showed hematuria. He said in the last 2 days, the left side flank/groin pain is not as bad but he is having dysuria. We will see him tomorrow. Advised Dr Silvio Pate.

## 2017-11-14 NOTE — Telephone Encounter (Signed)
I don't remember him ever having kidney stones--has he? What symptoms is he having? Back pain? Is he having visible blood? Not sure I want to just order a CT scan without having more information about what is going on.

## 2017-11-15 ENCOUNTER — Ambulatory Visit: Payer: Commercial Managed Care - PPO | Admitting: Internal Medicine

## 2017-11-15 ENCOUNTER — Encounter: Payer: Self-pay | Admitting: Internal Medicine

## 2017-11-15 VITALS — BP 140/88 | HR 88 | Temp 97.6°F | Ht 73.0 in | Wt 231.0 lb

## 2017-11-15 DIAGNOSIS — M25562 Pain in left knee: Secondary | ICD-10-CM | POA: Diagnosis not present

## 2017-11-15 DIAGNOSIS — K625 Hemorrhage of anus and rectum: Secondary | ICD-10-CM | POA: Diagnosis not present

## 2017-11-15 DIAGNOSIS — R319 Hematuria, unspecified: Secondary | ICD-10-CM | POA: Diagnosis not present

## 2017-11-15 LAB — POC URINALSYSI DIPSTICK (AUTOMATED)
Bilirubin, UA: NEGATIVE
Glucose, UA: NEGATIVE
KETONES UA: NEGATIVE
Leukocytes, UA: NEGATIVE
Nitrite, UA: NEGATIVE
PH UA: 6.5 (ref 5.0–8.0)
PROTEIN UA: NEGATIVE
RBC UA: NEGATIVE
SPEC GRAV UA: 1.015 (ref 1.010–1.025)
Urobilinogen, UA: 0.2 E.U./dL

## 2017-11-15 MED ORDER — DOXYCYCLINE HYCLATE 100 MG PO TABS: 100.0000 mg | ORAL_TABLET | Freq: Two times a day (BID) | ORAL | 1 refills | Status: DC

## 2017-11-15 MED ORDER — HYDROCORTISONE 2.5 % EX CREA: TOPICAL_CREAM | Freq: Three times a day (TID) | CUTANEOUS | 3 refills | Status: DC | PRN

## 2017-11-15 NOTE — Assessment & Plan Note (Signed)
Will try hydrocortisone cream instead of the suppositories

## 2017-11-15 NOTE — Assessment & Plan Note (Addendum)
Noted at urgent care Symptoms of colicky pain could be stone but also has clinical prostatitis Will treat for the prostate Check urinalysis----consider KUB or CT if still hematuria  U/A negative for blood now Will treat with doxy for 10-20 days
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
## 2017-11-15 NOTE — Assessment & Plan Note (Addendum)
I suspect a Baker's cyst Could get ultrasound at sports medicine or ortho He isn't in a rush On NSAID--aleve bid Will try heat  Ortho referral if worsens

## 2017-11-15 NOTE — Addendum Note (Signed)
Addended by: Pilar Grammes on: 11/15/2017 11:53 AM   Modules accepted: Orders

## 2017-11-17 ENCOUNTER — Other Ambulatory Visit: Payer: Self-pay | Admitting: Internal Medicine

## 2017-11-19 ENCOUNTER — Telehealth: Payer: Self-pay | Admitting: *Deleted

## 2017-11-19 DIAGNOSIS — R319 Hematuria, unspecified: Secondary | ICD-10-CM

## 2017-11-19 NOTE — Telephone Encounter (Signed)
Spoke to pt who states he was recently seen for a possible kidney stone, and hematuria. He was given an abx, that he hasn't completed. He states that he is having dysuria and feels like he is "holding urine." He has a Hx of kidney stones, and is wanting to know if he can have some type of imaging procedure to confirm/deny if he is having prostate or urinary issues. pls advise

## 2017-11-20 ENCOUNTER — Ambulatory Visit
Admission: RE | Admit: 2017-11-20 | Discharge: 2017-11-20 | Disposition: A | Discharge status: Home / Self Care | Payer: Commercial Managed Care - PPO | Source: Ambulatory Visit | Attending: Internal Medicine | Admitting: Internal Medicine

## 2017-11-20 DIAGNOSIS — R319 Hematuria, unspecified: Secondary | ICD-10-CM | POA: Diagnosis not present

## 2017-11-20 DIAGNOSIS — N2889 Other specified disorders of kidney and ureter: Secondary | ICD-10-CM | POA: Insufficient documentation

## 2017-11-20 DIAGNOSIS — N132 Hydronephrosis with renal and ureteral calculous obstruction: Secondary | ICD-10-CM | POA: Diagnosis not present

## 2017-11-20 DIAGNOSIS — R932 Abnormal findings on diagnostic imaging of liver and biliary tract: Secondary | ICD-10-CM | POA: Diagnosis not present

## 2017-11-20 DIAGNOSIS — N200 Calculus of kidney: Secondary | ICD-10-CM | POA: Diagnosis not present

## 2017-11-20 MED ORDER — IOPAMIDOL (ISOVUE-300) INJECTION 61%
125.0000 mL | Freq: Once | INTRAVENOUS | Status: AC | PRN
  Administered 2017-11-20: 125 mL via INTRAVENOUS

## 2017-11-20 NOTE — Telephone Encounter (Signed)
Please let him know that I ordered a CT scan to be done at [facility] and he should hear about this soon

## 2017-11-20 NOTE — Telephone Encounter (Signed)
Spoke to pt

## 2017-11-21 ENCOUNTER — Other Ambulatory Visit: Payer: Self-pay | Admitting: Internal Medicine

## 2017-11-21 ENCOUNTER — Telehealth: Payer: Self-pay | Admitting: Internal Medicine

## 2017-11-21 DIAGNOSIS — N201 Calculus of ureter: Secondary | ICD-10-CM | POA: Diagnosis not present

## 2017-11-21 NOTE — Progress Notes (Signed)
Please set him up with [facility] unless Dr Erlene Quan can see him sooner

## 2017-11-21 NOTE — Telephone Encounter (Signed)
Great thank you so much :)

## 2017-11-21 NOTE — Telephone Encounter (Signed)
FYI, Patient has an Appt today with Dr Junious Silk at [facility] and he is aware.

## 2017-12-07 ENCOUNTER — Other Ambulatory Visit: Payer: Self-pay | Admitting: Internal Medicine

## 2017-12-20 DIAGNOSIS — M2392 Unspecified internal derangement of left knee: Secondary | ICD-10-CM | POA: Diagnosis not present

## 2017-12-20 DIAGNOSIS — M25562 Pain in left knee: Secondary | ICD-10-CM | POA: Diagnosis not present

## 2018-01-06 NOTE — Addendum Note (Signed)
Addended by: Pilar Grammes on: 01/06/2018 12:53 PM   Modules accepted: Orders

## 2018-01-22 ENCOUNTER — Encounter: Payer: Self-pay | Admitting: Internal Medicine

## 2018-01-22 ENCOUNTER — Ambulatory Visit (INDEPENDENT_AMBULATORY_CARE_PROVIDER_SITE_OTHER): Payer: Commercial Managed Care - PPO | Admitting: Internal Medicine

## 2018-01-22 VITALS — BP 122/86 | HR 84 | Temp 97.8°F | Ht 72.5 in | Wt 227.0 lb

## 2018-01-22 DIAGNOSIS — K219 Gastro-esophageal reflux disease without esophagitis: Secondary | ICD-10-CM

## 2018-01-22 DIAGNOSIS — K589 Irritable bowel syndrome without diarrhea: Secondary | ICD-10-CM

## 2018-01-22 DIAGNOSIS — Z125 Encounter for screening for malignant neoplasm of prostate: Secondary | ICD-10-CM | POA: Diagnosis not present

## 2018-01-22 DIAGNOSIS — Z0000 Encounter for general adult medical examination without abnormal findings: Secondary | ICD-10-CM | POA: Diagnosis not present

## 2018-01-22 DIAGNOSIS — E785 Hyperlipidemia, unspecified: Secondary | ICD-10-CM

## 2018-01-22 LAB — CBC
HCT: 47.4 % (ref 39.0–52.0)
Hemoglobin: 16.5 g/dL (ref 13.0–17.0)
MCHC: 34.8 g/dL (ref 30.0–36.0)
MCV: 88.1 fl (ref 78.0–100.0)
PLATELETS: 232 10*3/uL (ref 150.0–400.0)
RBC: 5.39 Mil/uL (ref 4.22–5.81)
RDW: 12.7 % (ref 11.5–15.5)
WBC: 4.5 10*3/uL (ref 4.0–10.5)

## 2018-01-22 LAB — LIPID PANEL
CHOLESTEROL: 212 mg/dL — AB (ref 0–200)
HDL: 41 mg/dL (ref >39.00)
LDL CALC: 150 mg/dL — AB (ref 0–99)
NONHDL: 170.96
Total CHOL/HDL Ratio: 5
Triglycerides: 103 mg/dL (ref 0.0–149.0)
VLDL: 20.6 mg/dL (ref 0.0–40.0)

## 2018-01-22 LAB — COMPREHENSIVE METABOLIC PANEL
ALT: 21 U/L (ref 0–53)
AST: 13 U/L (ref 0–37)
Albumin: 4.6 g/dL (ref 3.5–5.2)
Alkaline Phosphatase: 52 U/L (ref 39–117)
BUN: 20 mg/dL (ref 6–23)
CHLORIDE: 104 meq/L (ref 96–112)
CO2: 29 meq/L (ref 19–32)
CREATININE: 1.06 mg/dL (ref 0.40–1.50)
Calcium: 9.7 mg/dL (ref 8.4–10.5)
GFR: 76.63 mL/min (ref >60.00)
GLUCOSE: 97 mg/dL (ref 70–99)
POTASSIUM: 4.3 meq/L (ref 3.5–5.1)
SODIUM: 139 meq/L (ref 135–145)
Total Bilirubin: 0.7 mg/dL (ref 0.2–1.2)
Total Protein: 6.8 g/dL (ref 6.0–8.3)

## 2018-01-22 LAB — PSA: PSA: 1.69 ng/mL (ref 0.10–4.00)

## 2018-01-22 NOTE — Assessment & Plan Note (Signed)
Healthy but out of shape with family responsibilities Will check PSA Colon due 2027 Yearly flu vaccine

## 2018-01-22 NOTE — Assessment & Plan Note (Signed)
Will recheck
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
## 2018-01-22 NOTE — Assessment & Plan Note (Signed)
Okay with famotidine °

## 2018-01-22 NOTE — Assessment & Plan Note (Signed)
Still on the amitriptyline

## 2018-02-12 ENCOUNTER — Other Ambulatory Visit: Payer: Self-pay | Admitting: Internal Medicine

## 2018-04-02 DIAGNOSIS — M25561 Pain in right knee: Secondary | ICD-10-CM | POA: Diagnosis not present

## 2018-04-02 DIAGNOSIS — M2391 Unspecified internal derangement of right knee: Secondary | ICD-10-CM | POA: Diagnosis not present

## 2018-09-29 ENCOUNTER — Other Ambulatory Visit: Payer: Self-pay | Admitting: Internal Medicine

## 2019-01-27 ENCOUNTER — Encounter: Payer: Self-pay | Admitting: Internal Medicine

## 2019-01-27 ENCOUNTER — Ambulatory Visit (INDEPENDENT_AMBULATORY_CARE_PROVIDER_SITE_OTHER): Payer: Commercial Managed Care - PPO | Admitting: Internal Medicine

## 2019-01-27 ENCOUNTER — Other Ambulatory Visit: Payer: Self-pay

## 2019-01-27 VITALS — BP 134/80 | HR 81 | Temp 97.9°F | Ht 72.5 in | Wt 228.8 lb

## 2019-01-27 DIAGNOSIS — M25562 Pain in left knee: Secondary | ICD-10-CM | POA: Diagnosis not present

## 2019-01-27 DIAGNOSIS — Z0000 Encounter for general adult medical examination without abnormal findings: Secondary | ICD-10-CM

## 2019-01-27 DIAGNOSIS — E785 Hyperlipidemia, unspecified: Secondary | ICD-10-CM

## 2019-01-27 DIAGNOSIS — K589 Irritable bowel syndrome without diarrhea: Secondary | ICD-10-CM | POA: Diagnosis not present

## 2019-01-27 DIAGNOSIS — G8929 Other chronic pain: Secondary | ICD-10-CM

## 2019-01-27 LAB — CBC
HCT: 47.4 % (ref 39.0–52.0)
Hemoglobin: 16 g/dL (ref 13.0–17.0)
MCHC: 33.8 g/dL (ref 30.0–36.0)
MCV: 89.7 fl (ref 78.0–100.0)
Platelets: 247 10*3/uL (ref 150.0–400.0)
RBC: 5.28 Mil/uL (ref 4.22–5.81)
RDW: 12.6 % (ref 11.5–15.5)
WBC: 4.6 10*3/uL (ref 4.0–10.5)

## 2019-01-27 LAB — COMPREHENSIVE METABOLIC PANEL
ALT: 18 U/L (ref 0–53)
AST: 15 U/L (ref 0–37)
Albumin: 4.6 g/dL (ref 3.5–5.2)
Alkaline Phosphatase: 50 U/L (ref 39–117)
BUN: 18 mg/dL (ref 6–23)
CO2: 31 mEq/L (ref 19–32)
Calcium: 9.4 mg/dL (ref 8.4–10.5)
Chloride: 105 mEq/L (ref 96–112)
Creatinine, Ser: 0.98 mg/dL (ref 0.40–1.50)
GFR: 78.65 mL/min (ref >60.00)
Glucose, Bld: 92 mg/dL (ref 70–99)
Potassium: 4.5 mEq/L (ref 3.5–5.1)
Sodium: 142 mEq/L (ref 135–145)
Total Bilirubin: 1.1 mg/dL (ref 0.2–1.2)
Total Protein: 6.6 g/dL (ref 6.0–8.3)

## 2019-01-27 LAB — LIPID PANEL
Cholesterol: 205 mg/dL — ABNORMAL HIGH (ref 0–200)
HDL: 33.2 mg/dL — ABNORMAL LOW (ref >39.00)
LDL Cholesterol: 151 mg/dL — ABNORMAL HIGH (ref 0–99)
NonHDL: 172.23
Total CHOL/HDL Ratio: 6
Triglycerides: 104 mg/dL (ref 0.0–149.0)
VLDL: 20.8 mg/dL (ref 0.0–40.0)

## 2019-01-27 MED ORDER — CYCLOBENZAPRINE HCL 10 MG PO TABS: 10.0000 mg | ORAL_TABLET | Freq: Every evening | ORAL | 2 refills | Status: DC | PRN

## 2019-01-27 NOTE — Assessment & Plan Note (Signed)
Healthy but out of shape Biggest stress is caring for mom Colon due 2027--unless more blood He skipped flu vaccine this year---should get next year He is uncertain about COVID vaccine---urged him to consider Defer PSA to next year

## 2019-01-27 NOTE — Assessment & Plan Note (Signed)
Continues on amitripyline

## 2019-01-27 NOTE — Assessment & Plan Note (Signed)
Now just using aleve bid prn relafen in the past

## 2019-01-27 NOTE — Assessment & Plan Note (Signed)
Mild Will recheck
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
## 2019-04-15 ENCOUNTER — Ambulatory Visit: Payer: Commercial Managed Care - PPO | Admitting: Internal Medicine

## 2019-04-15 ENCOUNTER — Other Ambulatory Visit: Payer: Self-pay

## 2019-04-15 ENCOUNTER — Encounter: Payer: Self-pay | Admitting: Internal Medicine

## 2019-04-15 DIAGNOSIS — N410 Acute prostatitis: Secondary | ICD-10-CM | POA: Diagnosis not present

## 2019-04-15 LAB — POC URINALSYSI DIPSTICK (AUTOMATED)
Bilirubin, UA: NEGATIVE
Blood, UA: NEGATIVE
Glucose, UA: NEGATIVE
Ketones, UA: NEGATIVE
Leukocytes, UA: NEGATIVE
Nitrite, UA: NEGATIVE
Protein, UA: NEGATIVE
Spec Grav, UA: 1.01 (ref 1.010–1.025)
Urobilinogen, UA: 0.2 E.U./dL
pH, UA: 8 (ref 5.0–8.0)

## 2019-04-15 MED ORDER — DOXYCYCLINE HYCLATE 100 MG PO TABS: 100.0000 mg | ORAL_TABLET | Freq: Two times a day (BID) | ORAL | 0 refills | Status: DC

## 2019-04-15 MED ORDER — TAMSULOSIN HCL 0.4 MG PO CAPS: 0.8000 mg | ORAL_CAPSULE | Freq: Every day | ORAL | 3 refills | Status: DC

## 2019-04-15 NOTE — Addendum Note (Signed)
Addended by: Pilar Grammes on: 04/15/2019 11:49 AM   Modules accepted: Orders

## 2019-04-15 NOTE — Assessment & Plan Note (Signed)
Has had this frequently and repetitively No blood --so would not check into bladder now (despite the anterior tenderness) Trial doxy again (will give 3 weeks) Increase tamsulosin to 2 daily Urologist if symptoms persist
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
## 2019-05-09 IMAGING — CT CT ABD-PEL WO/W CM
3 of 12 series · 11 of 46 positions shown, 17 images · IV contrast (iopamidol)
Comparison: CT 12/27/2003

CLINICAL DATA: Pt states he is having bladders sensation of burning
and pressure with frequent urgency and then once he does void not
much urination happens since 10/22/2017. Pt states he went to [HOSPITAL] and he had microscopic hematuria on...*comment was
truncated*^125mL VK0OV4-KDD IOPAMIDOL (VK0OV4-KDD) INJECTION
61%Hematuria, unknown cause hematuria, flank pain, past stone

EXAM:
CT ABDOMEN AND PELVIS WITHOUT AND WITH CONTRAST
TECHNIQUE: Multidetector CT imaging of the abdomen and pelvis was performed
following the standard protocol before and following the bolus
administration of intravenous contrast.
CONTRAST:  125mL VK0OV4-KDD IOPAMIDOL (VK0OV4-KDD) INJECTION 61%

[Series 5: cor without without pre · coronal · non-contrast · 0.81mm/px · 2 of 167 slices shown, 3 images]
[im 56/167  soft-tissue]
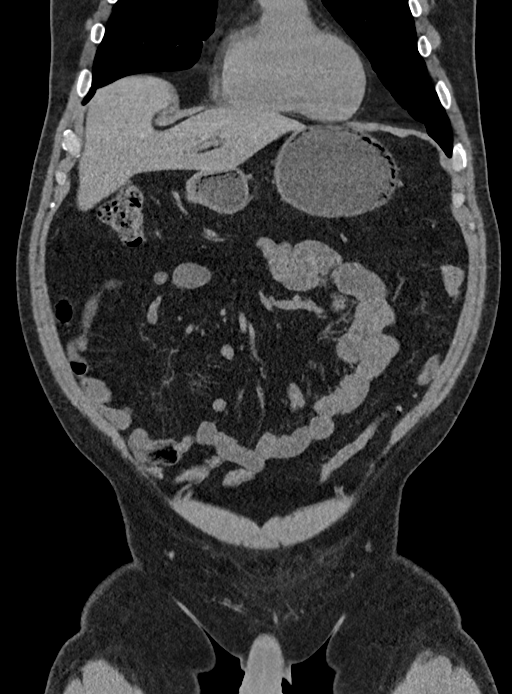
[im 56/167  bone]
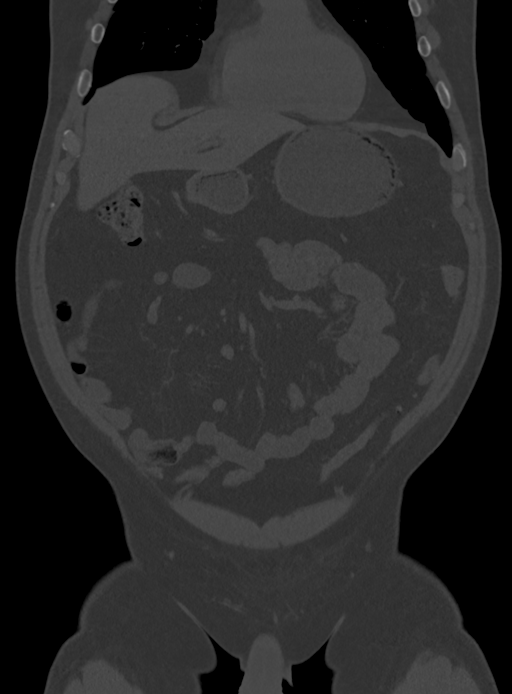
[im 111/167  soft-tissue]
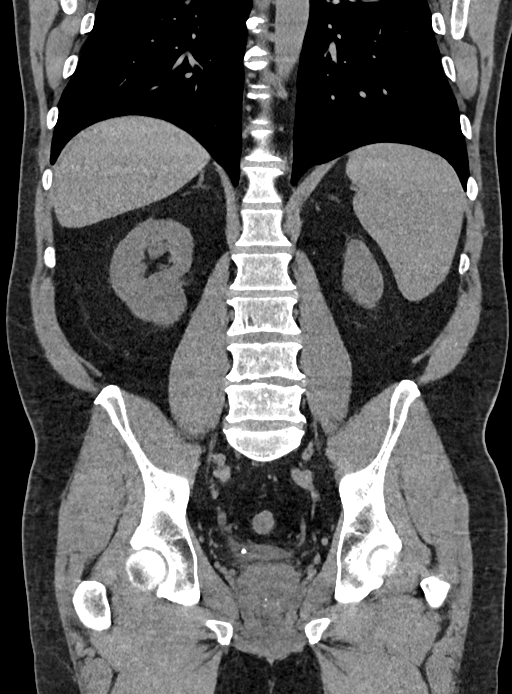

[Series 9: axial with hematuria with · axial · 0.81mm/px · z∈[-1578,-1183]mm · 6 of 111 slices shown, 11 images]
[im 16/111  soft-tissue]
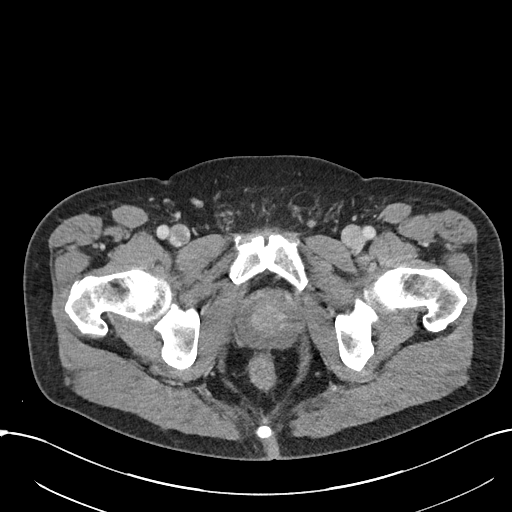
[im 16/111  bone]
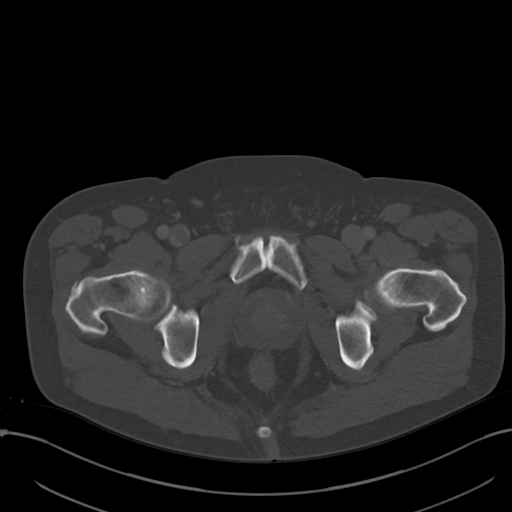
[im 32/111  soft-tissue]
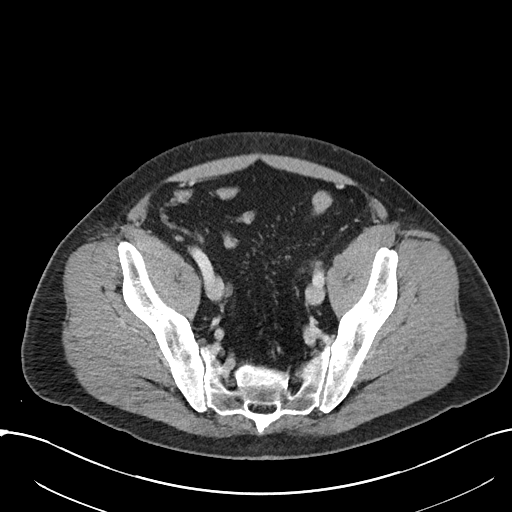
[im 48/111  soft-tissue]
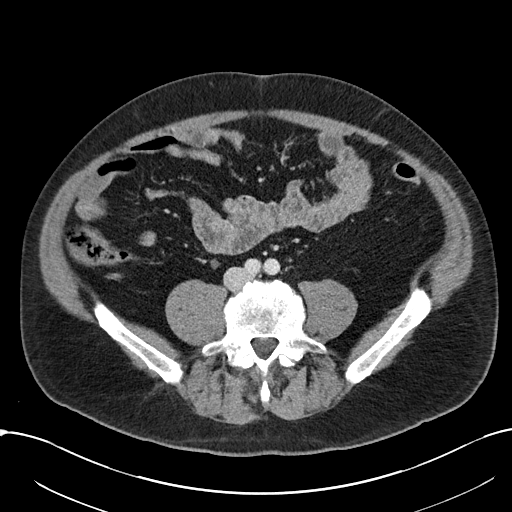
[im 48/111  lung]
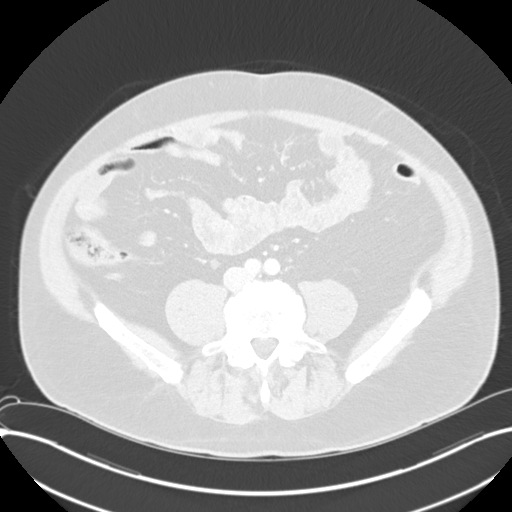
[im 63/111  soft-tissue]
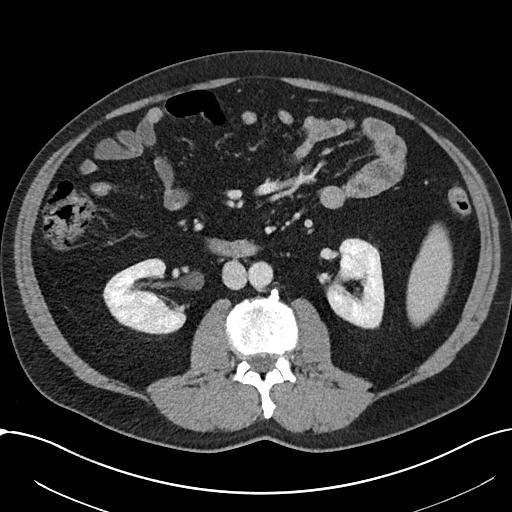
[im 63/111  lung]
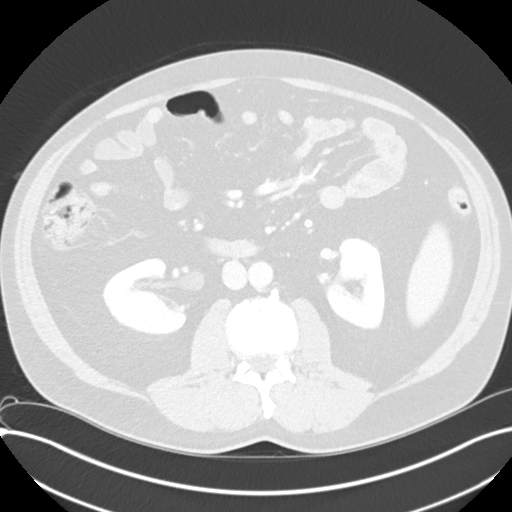
[im 79/111  soft-tissue]
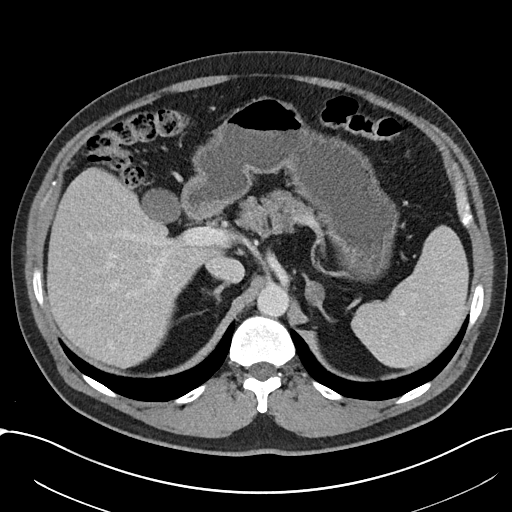
[im 79/111  lung]
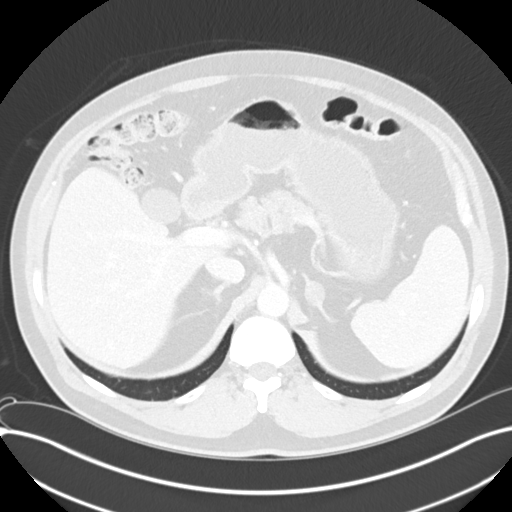
[im 95/111  soft-tissue]
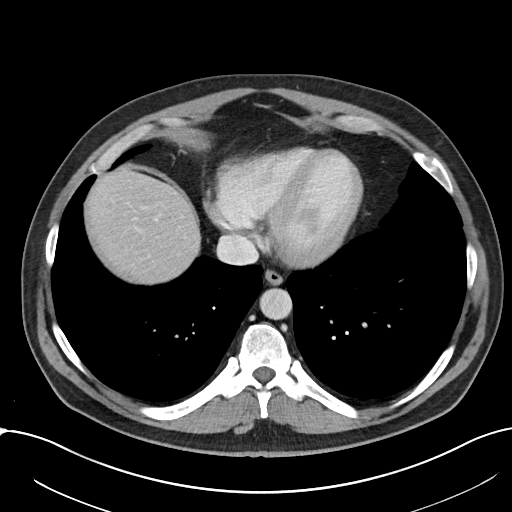
[im 95/111  lung]
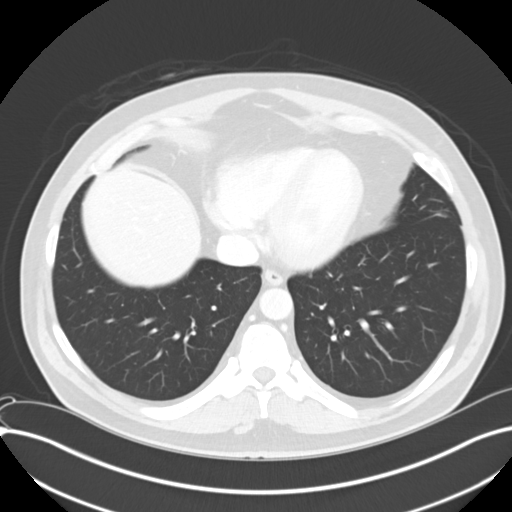

[Series 17: axial delay delay prone · axial · delayed · 0.81mm/px · z∈[-1664,-1504]mm · 3 of 113 slices shown]
[im 17/113  soft-tissue]
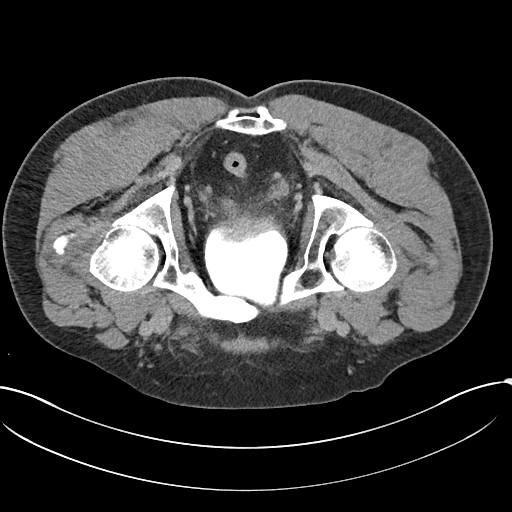
[im 33/113  soft-tissue]
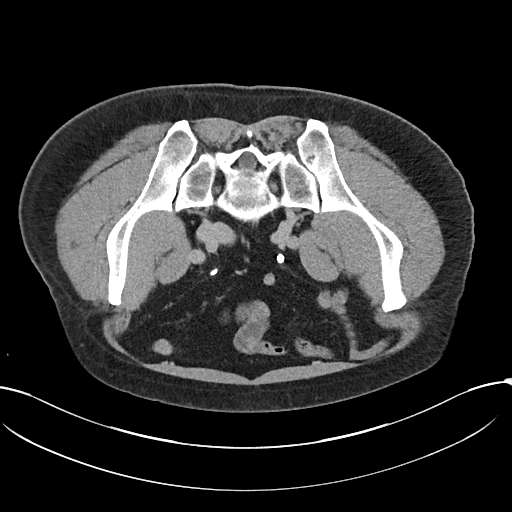
[im 49/113  soft-tissue]
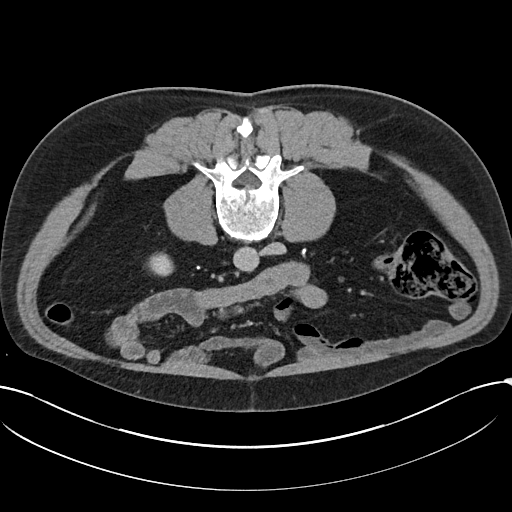

[11 of 46 positions shown; findings below may reference images not displayed]

FINDINGS: Lower chest: Lung bases are clear.

Hepatobiliary: Low-density lesion in the dome the liver with
peripheral enhancement measuring 16 x 15 mm likely represents a
hemangioma. Gallbladder normal. No hepatic biliary duct dilatation.

Pancreas: Pancreas is normal. No ductal dilatation. No pancreatic
inflammation.

Spleen: Normal spleen

Adrenals/urinary tract: Low-density the LEFT adrenal adenoma. RIGHT
adrenal gland normal.

No nephrolithiasis in the LEFT kidney. Coarse calcification in the
cortex of the RIGHT kidney measures 6 mm. There is mild hydroureter
on the RIGHT. Partially obstructing calculus in the distal RIGHT
ureter at the vesicoureteral junction. This calculus is linear
measuring 6 mm (image 89/2).

Contrast enhanced imaging demonstrates no enhancing renal cortical
lesion. Delayed imaging demonstrates contrast passing the distal
RIGHT ureteral calculus with mild hydroureter and hydronephrosis on
the RIGHT.

There is a caliceal diverticulum in the mid RIGHT renal cortex which
contains several small calcifications (described above).

No bladder calculi, enhancing bladder lesions, or filling defect
within the bladder.

Stomach/Bowel: Stomach, small bowel, appendix, and cecum are normal.
The colon and rectosigmoid colon are normal.

Vascular/Lymphatic: Abdominal aorta is normal caliber. No periportal
or retroperitoneal adenopathy. No pelvic adenopathy.

Reproductive: Prostate normal.

Other: Small bilateral fat filled inguinal hernias

Musculoskeletal: No aggressive osseous lesion.
IMPRESSION: 1. Partially obstructing calculus in the distal RIGHT ureter at the
RIGHT vesicoureteral junction. Mild hydronephrosis and hydroureter
on the RIGHT.
2. Small calcifications within a RIGHT renal caliceal diverticulum.
3. No bladder stones or filling defects in the bladder which does
not excluded a bladder lesion.
4. Probable benign hemangioma in the dome of the liver.

## 2019-05-11 MED ORDER — AMITRIPTYLINE HCL 25 MG PO TABS: 25.0000 mg | ORAL_TABLET | Freq: Every evening | ORAL | 3 refills | Status: DC | PRN

## 2019-05-11 NOTE — Telephone Encounter (Signed)
Rx sent electronically.  

## 2019-08-19 ENCOUNTER — Telehealth: Payer: Self-pay | Admitting: Internal Medicine

## 2019-08-19 MED ORDER — NABUMETONE 750 MG PO TABS: 750.0000 mg | ORAL_TABLET | Freq: Two times a day (BID) | ORAL | 3 refills | Status: DC | PRN

## 2019-08-19 NOTE — Telephone Encounter (Signed)
Having more pain with his knees---worsening OA Saw Mr Rogers Blocker at [facility] and it helped tremendously---but he won't refill it Also had relief with aleve 2 bid---but the relief didn't hold Has normal GFR---will refill Is on pepcid bid

## 2019-08-21 ENCOUNTER — Ambulatory Visit: Payer: Self-pay

## 2019-08-21 ENCOUNTER — Ambulatory Visit
Admission: EM | Admit: 2019-08-21 | Discharge: 2019-08-21 | Disposition: A | Discharge status: Home / Self Care | Payer: Commercial Managed Care - PPO | Attending: Internal Medicine | Admitting: Internal Medicine

## 2019-08-21 ENCOUNTER — Other Ambulatory Visit: Payer: Self-pay

## 2019-08-21 DIAGNOSIS — K589 Irritable bowel syndrome without diarrhea: Secondary | ICD-10-CM | POA: Diagnosis not present

## 2019-08-21 DIAGNOSIS — Z791 Long term (current) use of non-steroidal anti-inflammatories (NSAID): Secondary | ICD-10-CM | POA: Diagnosis not present

## 2019-08-21 DIAGNOSIS — E785 Hyperlipidemia, unspecified: Secondary | ICD-10-CM | POA: Diagnosis not present

## 2019-08-21 DIAGNOSIS — Z882 Allergy status to sulfonamides status: Secondary | ICD-10-CM | POA: Insufficient documentation

## 2019-08-21 DIAGNOSIS — Z79899 Other long term (current) drug therapy: Secondary | ICD-10-CM | POA: Diagnosis not present

## 2019-08-21 DIAGNOSIS — Z20822 Contact with and (suspected) exposure to covid-19: Secondary | ICD-10-CM | POA: Insufficient documentation

## 2019-08-21 DIAGNOSIS — Z886 Allergy status to analgesic agent status: Secondary | ICD-10-CM | POA: Insufficient documentation

## 2019-08-21 DIAGNOSIS — K219 Gastro-esophageal reflux disease without esophagitis: Secondary | ICD-10-CM | POA: Insufficient documentation

## 2019-08-21 DIAGNOSIS — J0100 Acute maxillary sinusitis, unspecified: Secondary | ICD-10-CM | POA: Insufficient documentation

## 2019-08-21 DIAGNOSIS — M1990 Unspecified osteoarthritis, unspecified site: Secondary | ICD-10-CM | POA: Insufficient documentation

## 2019-08-21 LAB — SARS CORONAVIRUS 2 (TAT 6-24 HRS): SARS Coronavirus 2: NEGATIVE

## 2019-08-21 MED ORDER — FLUTICASONE PROPIONATE 50 MCG/ACT NA SUSP: 1.0000 | Freq: Every day | NASAL | 2 refills | Status: DC

## 2019-08-21 MED ORDER — SALINE SPRAY 0.65 % NA SOLN: 1.0000 | NASAL | 0 refills | Status: DC | PRN

## 2019-08-21 MED ORDER — CLARITIN-D 24 HOUR 10-240 MG PO TB24: 1.0000 | ORAL_TABLET | Freq: Every day | ORAL | Status: DC
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
## 2019-12-17 ENCOUNTER — Ambulatory Visit
Admission: RE | Admit: 2019-12-17 | Discharge: 2019-12-17 | Disposition: A | Discharge status: Home / Self Care | Payer: Commercial Managed Care - PPO | Source: Ambulatory Visit | Attending: Urgent Care | Admitting: Urgent Care

## 2019-12-17 VITALS — BP 153/99 | HR 98 | Temp 98.8°F | Resp 18

## 2019-12-17 DIAGNOSIS — K1370 Unspecified lesions of oral mucosa: Secondary | ICD-10-CM | POA: Diagnosis not present

## 2019-12-17 DIAGNOSIS — B370 Candidal stomatitis: Secondary | ICD-10-CM

## 2019-12-17 MED ORDER — CHLORHEXIDINE GLUCONATE 0.12 % MT SOLN: OROMUCOSAL | 0 refills | Status: DC

## 2019-12-17 MED ORDER — FLUCONAZOLE 200 MG PO TABS: 200.0000 mg | ORAL_TABLET | Freq: Every day | ORAL | 0 refills | Status: AC

## 2019-12-17 NOTE — Discharge Instructions (Signed)
Please use the oral chlorhexidine rinse twice daily for the next 5 to 7 days.  Take Diflucan once daily for a week to address oral thrush, fungal infection.

## 2019-12-17 NOTE — ED Triage Notes (Signed)
Pt presents with ulcer on left side of tongue and in throat x 2 days.  Left side of tongue was sore last week like he may have bitten down on it and initially had ulcer but it went away then returned.  Denies sore throat, fever. Had tonsil tag removed 8-10 years ago and sore is in that spot.
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
## 2020-01-04 ENCOUNTER — Telehealth: Payer: Self-pay | Admitting: Internal Medicine

## 2020-01-04 NOTE — Telephone Encounter (Signed)
Error

## 2020-01-29 ENCOUNTER — Encounter: Payer: Commercial Managed Care - PPO | Admitting: Internal Medicine

## 2020-02-02 ENCOUNTER — Encounter: Payer: Self-pay | Admitting: Internal Medicine

## 2020-02-02 ENCOUNTER — Other Ambulatory Visit: Payer: Self-pay

## 2020-02-02 ENCOUNTER — Ambulatory Visit (INDEPENDENT_AMBULATORY_CARE_PROVIDER_SITE_OTHER): Payer: Commercial Managed Care - PPO | Admitting: Internal Medicine

## 2020-02-02 VITALS — BP 134/86 | HR 86 | Temp 96.9°F | Ht 72.5 in | Wt 221.0 lb

## 2020-02-02 DIAGNOSIS — K589 Irritable bowel syndrome without diarrhea: Secondary | ICD-10-CM

## 2020-02-02 DIAGNOSIS — N411 Chronic prostatitis: Secondary | ICD-10-CM | POA: Diagnosis not present

## 2020-02-02 DIAGNOSIS — Z125 Encounter for screening for malignant neoplasm of prostate: Secondary | ICD-10-CM

## 2020-02-02 DIAGNOSIS — M25562 Pain in left knee: Secondary | ICD-10-CM

## 2020-02-02 DIAGNOSIS — Z0000 Encounter for general adult medical examination without abnormal findings: Secondary | ICD-10-CM | POA: Diagnosis not present

## 2020-02-02 DIAGNOSIS — K219 Gastro-esophageal reflux disease without esophagitis: Secondary | ICD-10-CM

## 2020-02-02 LAB — COMPREHENSIVE METABOLIC PANEL
ALT: 20 U/L (ref 0–53)
AST: 13 U/L (ref 0–37)
Albumin: 4.5 g/dL (ref 3.5–5.2)
Alkaline Phosphatase: 62 U/L (ref 39–117)
BUN: 19 mg/dL (ref 6–23)
CO2: 29 mEq/L (ref 19–32)
Calcium: 9.4 mg/dL (ref 8.4–10.5)
Chloride: 107 mEq/L (ref 96–112)
Creatinine, Ser: 0.99 mg/dL (ref 0.40–1.50)
GFR: 83.86 mL/min (ref >60.00)
Glucose, Bld: 91 mg/dL (ref 70–99)
Potassium: 4.6 mEq/L (ref 3.5–5.1)
Sodium: 141 mEq/L (ref 135–145)
Total Bilirubin: 0.6 mg/dL (ref 0.2–1.2)
Total Protein: 6.5 g/dL (ref 6.0–8.3)

## 2020-02-02 LAB — CBC
HCT: 44.6 % (ref 39.0–52.0)
Hemoglobin: 15.3 g/dL (ref 13.0–17.0)
MCHC: 34.3 g/dL (ref 30.0–36.0)
MCV: 88.2 fl (ref 78.0–100.0)
Platelets: 226 10*3/uL (ref 150.0–400.0)
RBC: 5.06 Mil/uL (ref 4.22–5.81)
RDW: 12.5 % (ref 11.5–15.5)
WBC: 4.1 10*3/uL (ref 4.0–10.5)

## 2020-02-02 LAB — PSA: PSA: 2.16 ng/mL (ref 0.10–4.00)

## 2020-02-02 NOTE — Assessment & Plan Note (Signed)
Uses the famotidine sporadically
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
## 2020-02-02 NOTE — Assessment & Plan Note (Signed)
Healthy Discussed working on fitness He is still not excited about COVID vaccine--I urged him to consider No flu vaccine yet Encouraged shingrix--will consider Colon due 2027 Will check PSA

## 2020-02-02 NOTE — Assessment & Plan Note (Signed)
Doing okay on amitriptyline

## 2020-02-02 NOTE — Assessment & Plan Note (Signed)
Using the relafen

## 2020-02-02 NOTE — Assessment & Plan Note (Signed)
Some scrotal symptoms--but doesn't seem to be infectious Continue the tamsulosin

## 2020-04-22 ENCOUNTER — Other Ambulatory Visit: Payer: Self-pay | Admitting: Internal Medicine

## 2020-05-26 ENCOUNTER — Ambulatory Visit: Payer: Commercial Managed Care - PPO | Admitting: Internal Medicine

## 2020-05-26 ENCOUNTER — Encounter: Payer: Self-pay | Admitting: Internal Medicine

## 2020-05-26 ENCOUNTER — Other Ambulatory Visit: Payer: Self-pay

## 2020-05-26 ENCOUNTER — Telehealth: Payer: Self-pay | Admitting: Internal Medicine

## 2020-05-26 DIAGNOSIS — H6980 Other specified disorders of Eustachian tube, unspecified ear: Secondary | ICD-10-CM | POA: Insufficient documentation

## 2020-05-26 DIAGNOSIS — H6983 Other specified disorders of Eustachian tube, bilateral: Secondary | ICD-10-CM

## 2020-05-26 MED ORDER — PREDNISONE 20 MG PO TABS: 40.0000 mg | ORAL_TABLET | Freq: Every day | ORAL | 0 refills | Status: DC

## 2020-05-26 NOTE — Telephone Encounter (Signed)
Spoke to pt. Made app for 145 today.

## 2020-05-26 NOTE — Telephone Encounter (Signed)
Pt called in he has been having ear pain for the past 4 days and wanted to know if Dr. Silvio Pate can work him in today.
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
## 2020-05-26 NOTE — Assessment & Plan Note (Signed)
Discussed allergic etiology (not sick or obvious infection) Will give 3 days of prednisone Discussed adding certirizine or fexofenadine

## 2020-06-13 ENCOUNTER — Other Ambulatory Visit: Payer: Self-pay | Admitting: Internal Medicine

## 2020-07-10 ENCOUNTER — Other Ambulatory Visit: Payer: Self-pay | Admitting: Internal Medicine

## 2020-08-25 ENCOUNTER — Other Ambulatory Visit: Payer: Self-pay | Admitting: Internal Medicine

## 2020-08-25 NOTE — Telephone Encounter (Signed)
Last filled 07-15-20 #60 Last OV 05-26-20 Next OV 02-03-21 [facility]

## 2021-02-03 ENCOUNTER — Ambulatory Visit (INDEPENDENT_AMBULATORY_CARE_PROVIDER_SITE_OTHER): Payer: Commercial Managed Care - PPO | Admitting: Internal Medicine

## 2021-02-03 ENCOUNTER — Encounter: Payer: Self-pay | Admitting: Internal Medicine

## 2021-02-03 ENCOUNTER — Other Ambulatory Visit: Payer: Self-pay

## 2021-02-03 VITALS — BP 130/86 | HR 79 | Temp 97.3°F | Ht 73.0 in | Wt 225.0 lb

## 2021-02-03 DIAGNOSIS — N411 Chronic prostatitis: Secondary | ICD-10-CM | POA: Diagnosis not present

## 2021-02-03 DIAGNOSIS — K219 Gastro-esophageal reflux disease without esophagitis: Secondary | ICD-10-CM

## 2021-02-03 DIAGNOSIS — Z23 Encounter for immunization: Secondary | ICD-10-CM

## 2021-02-03 DIAGNOSIS — Z0000 Encounter for general adult medical examination without abnormal findings: Secondary | ICD-10-CM | POA: Diagnosis not present

## 2021-02-03 LAB — LIPID PANEL
Cholesterol: 242 mg/dL — ABNORMAL HIGH (ref 0–200)
HDL: 37.3 mg/dL — ABNORMAL LOW (ref >39.00)
LDL Cholesterol: 178 mg/dL — ABNORMAL HIGH (ref 0–99)
NonHDL: 204.39
Total CHOL/HDL Ratio: 6
Triglycerides: 133 mg/dL (ref 0.0–149.0)
VLDL: 26.6 mg/dL (ref 0.0–40.0)

## 2021-02-03 LAB — COMPREHENSIVE METABOLIC PANEL
ALT: 30 U/L (ref 0–53)
AST: 17 U/L (ref 0–37)
Albumin: 4.7 g/dL (ref 3.5–5.2)
Alkaline Phosphatase: 57 U/L (ref 39–117)
BUN: 21 mg/dL (ref 6–23)
CO2: 31 mEq/L (ref 19–32)
Calcium: 9.9 mg/dL (ref 8.4–10.5)
Chloride: 104 mEq/L (ref 96–112)
Creatinine, Ser: 1.06 mg/dL (ref 0.40–1.50)
GFR: 76.72 mL/min (ref >60.00)
Glucose, Bld: 91 mg/dL (ref 70–99)
Potassium: 4.7 mEq/L (ref 3.5–5.1)
Sodium: 142 mEq/L (ref 135–145)
Total Bilirubin: 1.4 mg/dL — ABNORMAL HIGH (ref 0.2–1.2)
Total Protein: 6.9 g/dL (ref 6.0–8.3)

## 2021-02-03 LAB — CBC
HCT: 48 % (ref 39.0–52.0)
Hemoglobin: 16.1 g/dL (ref 13.0–17.0)
MCHC: 33.5 g/dL (ref 30.0–36.0)
MCV: 90 fl (ref 78.0–100.0)
Platelets: 225 10*3/uL (ref 150.0–400.0)
RBC: 5.34 Mil/uL (ref 4.22–5.81)
RDW: 12.8 % (ref 11.5–15.5)
WBC: 4.4 10*3/uL (ref 4.0–10.5)

## 2021-02-03 MED ORDER — DOXYCYCLINE HYCLATE 100 MG PO TABS: 100.0000 mg | ORAL_TABLET | Freq: Two times a day (BID) | ORAL | 1 refills | Status: DC
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
## 2021-02-03 NOTE — Addendum Note (Signed)
Addended by: Pilar Grammes on: 02/03/2021 10:02 AM   Modules accepted: Orders

## 2021-02-03 NOTE — Assessment & Plan Note (Signed)
Uses the pepcid bid

## 2021-02-03 NOTE — Assessment & Plan Note (Signed)
On the tamsulosin Will give doxy Rx for recurrences

## 2021-02-03 NOTE — Assessment & Plan Note (Signed)
Colon due 2027 Defer PSA to next year Td today He still prefers no flu/COVID vaccines for now Discussed shingrix--will consider Needs to restart exercise

## 2021-02-06 ENCOUNTER — Encounter: Payer: Self-pay | Admitting: Internal Medicine

## 2021-02-28 ENCOUNTER — Telehealth: Payer: Self-pay | Admitting: Internal Medicine

## 2021-02-28 ENCOUNTER — Encounter: Payer: Self-pay | Admitting: Internal Medicine

## 2021-02-28 NOTE — Telephone Encounter (Signed)
See note from front office. Patient refused to go to the ED.

## 2021-02-28 NOTE — Telephone Encounter (Signed)
PLEASE NOTE: All timestamps contained within this report are represented as [place] Standard Time. CONFIDENTIALTY NOTICE: This fax transmission is intended only for the addressee. It contains information that is legally privileged, confidential or otherwise protected from use or disclosure. If you are not the intended recipient, you are strictly prohibited from reviewing, disclosing, copying using or disseminating any of this information or taking any action in reliance on or regarding this information. If you have received this fax in error, please notify us immediately by telephone so that we can arrange for its return to [place]. Phone: [phone], Toll-Free: [phone], Fax: [phone] Page: 1 of 2 Call Id: [id number] [place] [facility] - Client TELEPHONE ADVICE RECORD [facility] Patient Name: Kyle Robinson Gender: Male DOB: [birth year] Age: 61 Y 9 M 4 D Return Phone Number: [phone] (Primary) Address: City/ State/ Zip: [place]   [address] [facility] - Client Client Site [place] - Day Provider Viviana Simpler- MD Contact Type Call Who Is Calling Patient / Member / Family / Caregiver Call Type Triage / Clinical Relationship To Patient Self Return Phone Number [phone] (Primary) Chief Complaint Coughing Up Blood Reason for Call Symptomatic / Request for Health Information Initial Comment [name] is coughing up blood with mucus Translation No Nurse Assessment Nurse: Clovis Riley RN, Georgina Peer Date/Time (Eastern Time): 02/28/2021 8:49:02 AM Confirm and document reason for call. If symptomatic, describe symptoms. ---[name] states he is coughing up mucous with blood in it. Positive for Flu A yesterday. no fever. States he is wheezing when he lays down. Does the patient have any new or worsening symptoms? ---Yes Will a triage be completed? ---Yes Related visit to physician within the last 2  weeks? ---Yes Does the PT have any chronic conditions? (i.e. diabetes, asthma, this includes High risk factors for pregnancy, etc.) ---Yes List chronic conditions. ---enlarged prostate Is this a behavioral health or substance abuse call? ---No Guidelines Guideline Title Affirmed Question Affirmed Notes Nurse Date/Time (Eastern Time) Coughing Up Blood Long-distance travel in past month (e.g., car, bus, train, plane; with trip lasting 6 or more hours) Clovis Riley, RN, Georgina Peer 02/28/2021 8:51:14 AM Disp. Time Eilene Ghazi Time) Disposition Final User 02/28/2021 8:54:07 AM Go to ED Now Yes Clovis Riley, RN, Roslynn Amble NOTE: All timestamps contained within this report are represented as [place] Standard Time. CONFIDENTIALTY NOTICE: This fax transmission is intended only for the addressee. It contains information that is legally privileged, confidential or otherwise protected from use or disclosure. If you are not the intended recipient, you are strictly prohibited from reviewing, disclosing, copying using or disseminating any of this information or taking any action in reliance on or regarding this information. If you have received this fax in error, please notify us immediately by telephone so that we can arrange for its return to [place]. Phone: [phone], Toll-Free: [phone], Fax: [phone] Page: 2 of 2 Call Id: [id number] [place] Disagree/Comply Comply [name] Understands Yes PreDisposition Call Doctor Care Advice Given Per Guideline GO TO ED NOW: * You need to be seen in the Emergency Department. * Go to the ED at ___________ [place] now. Drive carefully. CARE ADVICE given per Coughing Up Blood guideline. Comments User: Arman Bogus, RN Date/Time Eilene Ghazi Time): 02/28/2021 8:57:53 AM office notified of pt refusing to go to ER Referrals [facility] - ED [place] REFUSED
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
## 2021-02-28 NOTE — Telephone Encounter (Signed)
Went to [facility] yesterday. Symptoms  evening. Started Tamiflu last night. Nose has been bleeding. Coughing stuff up that was white and has changed color to dark brown. Several home covid tests. He is improving. He was asking for an antibiotic and I advised that would not be something he would need right now since he has a virus. Give it 5-7 days and if he is still coughing and having nasal congestion, then an antibiotic may be warranted. I told him as long as he is improving and fever free for 24 hrs, he should be able to wear a mask to go take care of his mom.
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
## 2021-03-20 ENCOUNTER — Encounter: Payer: Self-pay | Admitting: Internal Medicine

## 2021-03-21 MED ORDER — BENZONATATE 200 MG PO CAPS: 200.0000 mg | ORAL_CAPSULE | Freq: Three times a day (TID) | ORAL | 0 refills | Status: DC | PRN

## 2021-04-26 ENCOUNTER — Encounter: Payer: Self-pay | Admitting: Internal Medicine

## 2021-04-26 NOTE — Telephone Encounter (Signed)
Is this okay to refill pt hasn't had this refill since 2012, please advise ?

## 2021-04-27 MED ORDER — HYDROCORTISONE 2.5 % EX CREA: TOPICAL_CREAM | Freq: Three times a day (TID) | CUTANEOUS | 3 refills | Status: AC | PRN

## 2021-05-10 ENCOUNTER — Other Ambulatory Visit: Payer: Self-pay | Admitting: Internal Medicine

## 2021-06-27 ENCOUNTER — Other Ambulatory Visit: Payer: Self-pay | Admitting: Internal Medicine

## 2021-07-24 ENCOUNTER — Other Ambulatory Visit: Payer: Self-pay | Admitting: Internal Medicine

## 2021-10-11 ENCOUNTER — Other Ambulatory Visit: Payer: Self-pay | Admitting: Internal Medicine

## 2021-10-11 MED ORDER — NABUMETONE 750 MG PO TABS: 750.0000 mg | ORAL_TABLET | Freq: Two times a day (BID) | ORAL | 11 refills | Status: AC | PRN

## 2021-10-11 MED ORDER — CYCLOBENZAPRINE HCL 10 MG PO TABS: 10.0000 mg | ORAL_TABLET | Freq: Every evening | ORAL | 2 refills | Status: DC | PRN

## 2022-03-10 ENCOUNTER — Other Ambulatory Visit: Payer: Self-pay | Admitting: Internal Medicine

## 2022-03-19 HISTORY — PX: SQUAMOUS CELL CARCINOMA EXCISION: SHX2433

## 2022-03-26 ENCOUNTER — Encounter: Payer: Self-pay | Admitting: Internal Medicine

## 2022-03-26 ENCOUNTER — Ambulatory Visit (INDEPENDENT_AMBULATORY_CARE_PROVIDER_SITE_OTHER): Payer: Commercial Managed Care - PPO | Admitting: Internal Medicine

## 2022-03-26 VITALS — BP 122/80 | HR 76 | Temp 97.2°F | Ht 72.0 in | Wt 231.0 lb

## 2022-03-26 DIAGNOSIS — K219 Gastro-esophageal reflux disease without esophagitis: Secondary | ICD-10-CM | POA: Diagnosis not present

## 2022-03-26 DIAGNOSIS — E785 Hyperlipidemia, unspecified: Secondary | ICD-10-CM | POA: Diagnosis not present

## 2022-03-26 DIAGNOSIS — Z125 Encounter for screening for malignant neoplasm of prostate: Secondary | ICD-10-CM | POA: Diagnosis not present

## 2022-03-26 DIAGNOSIS — K589 Irritable bowel syndrome without diarrhea: Secondary | ICD-10-CM | POA: Diagnosis not present

## 2022-03-26 DIAGNOSIS — Z0000 Encounter for general adult medical examination without abnormal findings: Secondary | ICD-10-CM | POA: Diagnosis not present

## 2022-03-26 DIAGNOSIS — N411 Chronic prostatitis: Secondary | ICD-10-CM | POA: Diagnosis not present

## 2022-03-26 LAB — COMPREHENSIVE METABOLIC PANEL
ALT: 18 U/L (ref 0–53)
AST: 15 U/L (ref 0–37)
Albumin: 4.2 g/dL (ref 3.5–5.2)
Alkaline Phosphatase: 54 U/L (ref 39–117)
BUN: 17 mg/dL (ref 6–23)
CO2: 27 mEq/L (ref 19–32)
Calcium: 9 mg/dL (ref 8.4–10.5)
Chloride: 108 mEq/L (ref 96–112)
Creatinine, Ser: 0.97 mg/dL (ref 0.40–1.50)
GFR: 84.66 mL/min (ref >60.00)
Glucose, Bld: 86 mg/dL (ref 70–99)
Potassium: 4 mEq/L (ref 3.5–5.1)
Sodium: 141 mEq/L (ref 135–145)
Total Bilirubin: 0.9 mg/dL (ref 0.2–1.2)
Total Protein: 6.3 g/dL (ref 6.0–8.3)

## 2022-03-26 LAB — CBC
HCT: 44.6 % (ref 39.0–52.0)
Hemoglobin: 15.4 g/dL (ref 13.0–17.0)
MCHC: 34.5 g/dL (ref 30.0–36.0)
MCV: 88.5 fl (ref 78.0–100.0)
Platelets: 226 10*3/uL (ref 150.0–400.0)
RBC: 5.04 Mil/uL (ref 4.22–5.81)
RDW: 12.4 % (ref 11.5–15.5)
WBC: 3.8 10*3/uL — ABNORMAL LOW (ref 4.0–10.5)

## 2022-03-26 LAB — LIPID PANEL
Cholesterol: 204 mg/dL — ABNORMAL HIGH (ref 0–200)
HDL: 39.5 mg/dL (ref >39.00)
LDL Cholesterol: 149 mg/dL — ABNORMAL HIGH (ref 0–99)
NonHDL: 164.08
Total CHOL/HDL Ratio: 5
Triglycerides: 77 mg/dL (ref 0.0–149.0)
VLDL: 15.4 mg/dL (ref 0.0–40.0)

## 2022-03-26 LAB — PSA: PSA: 3.17 ng/mL (ref 0.10–4.00)

## 2022-03-26 NOTE — Assessment & Plan Note (Signed)
Controlled with bid pepcid

## 2022-03-26 NOTE — Assessment & Plan Note (Signed)
Healthy Working on fitness again Colon due 2027 Will check PSA Still prefers no flu/COVID vaccines Will consider shingrix
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
## 2022-03-26 NOTE — Assessment & Plan Note (Signed)
Does okay with the amitriptyline 25mg  at bedtime

## 2022-03-26 NOTE — Assessment & Plan Note (Signed)
Okay with the tamsulosin 0.8mg  daily

## 2022-03-26 NOTE — Assessment & Plan Note (Signed)
Discussed primary prevention with statin--will hold off

## 2022-04-05 ENCOUNTER — Encounter: Payer: Self-pay | Admitting: Internal Medicine

## 2022-04-09 ENCOUNTER — Other Ambulatory Visit: Payer: Self-pay | Admitting: Internal Medicine

## 2022-04-11 ENCOUNTER — Ambulatory Visit: Payer: Commercial Managed Care - PPO | Admitting: Internal Medicine

## 2022-04-11 ENCOUNTER — Encounter: Payer: Self-pay | Admitting: Internal Medicine

## 2022-04-11 VITALS — BP 126/82 | HR 82 | Temp 98.5°F | Ht 72.0 in | Wt 230.2 lb

## 2022-04-11 DIAGNOSIS — M1711 Unilateral primary osteoarthritis, right knee: Secondary | ICD-10-CM | POA: Insufficient documentation

## 2022-04-11 MED ORDER — TRIAMCINOLONE ACETONIDE 40 MG/ML IJ SUSP
40.0000 mg | Freq: Once | INTRAMUSCULAR | Status: AC
  Administered 2022-04-11: 40 mg via INTRAMUSCULAR
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
## 2022-04-11 NOTE — Addendum Note (Signed)
Addended by: Vaughan Browner on: 04/11/2022 10:37 AM   Modules accepted: Orders

## 2022-04-11 NOTE — Assessment & Plan Note (Addendum)
No sign of acute injury Discussed options---he does wish to proceed with cortisone injection Verbal consent  Sterile prep medial right knee Ethyl chloride then 2cc 1% lidocaine 40mg  triamcinolone/8cc 1% lidocaine instilled without difficulty Tolerated well Discussed home care and precautions

## 2022-04-18 ENCOUNTER — Ambulatory Visit: Payer: Commercial Managed Care - PPO | Admitting: Internal Medicine

## 2022-04-18 ENCOUNTER — Encounter: Payer: Self-pay | Admitting: Internal Medicine

## 2022-04-18 VITALS — BP 124/84 | HR 60 | Temp 97.2°F | Ht 72.0 in | Wt 227.0 lb

## 2022-04-18 DIAGNOSIS — M5431 Sciatica, right side: Secondary | ICD-10-CM | POA: Diagnosis not present

## 2022-04-18 MED ORDER — PREDNISONE 20 MG PO TABS: 40.0000 mg | ORAL_TABLET | Freq: Every day | ORAL | 0 refills | Status: DC

## 2022-04-18 NOTE — Assessment & Plan Note (Signed)
Nothing to suggest central nerve involvement Discussed taking the relafen regularly while on the cruise (starts tomorrow) Flexeril for bedtime Prednisone 40 x 5, 20 x 5
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
## 2022-05-10 ENCOUNTER — Other Ambulatory Visit: Payer: Self-pay | Admitting: Internal Medicine

## 2022-05-22 ENCOUNTER — Encounter: Payer: Self-pay | Admitting: Internal Medicine

## 2022-05-22 MED ORDER — PREDNISONE 20 MG PO TABS: 40.0000 mg | ORAL_TABLET | Freq: Every day | ORAL | 0 refills | Status: DC

## 2022-08-07 ENCOUNTER — Other Ambulatory Visit: Payer: Self-pay | Admitting: Internal Medicine

## 2022-08-10 ENCOUNTER — Other Ambulatory Visit: Payer: Self-pay | Admitting: Internal Medicine

## 2022-09-05 ENCOUNTER — Other Ambulatory Visit: Payer: Self-pay | Admitting: Internal Medicine

## 2022-12-02 ENCOUNTER — Other Ambulatory Visit: Payer: Self-pay | Admitting: Internal Medicine

## 2023-03-28 ENCOUNTER — Ambulatory Visit (INDEPENDENT_AMBULATORY_CARE_PROVIDER_SITE_OTHER): Payer: Commercial Managed Care - PPO | Admitting: Internal Medicine

## 2023-03-28 ENCOUNTER — Encounter: Payer: Self-pay | Admitting: Internal Medicine

## 2023-03-28 VITALS — BP 124/82 | HR 74 | Temp 97.8°F | Ht 72.5 in | Wt 209.0 lb

## 2023-03-28 DIAGNOSIS — K589 Irritable bowel syndrome without diarrhea: Secondary | ICD-10-CM

## 2023-03-28 DIAGNOSIS — M1711 Unilateral primary osteoarthritis, right knee: Secondary | ICD-10-CM

## 2023-03-28 DIAGNOSIS — Z0000 Encounter for general adult medical examination without abnormal findings: Secondary | ICD-10-CM

## 2023-03-28 DIAGNOSIS — E785 Hyperlipidemia, unspecified: Secondary | ICD-10-CM | POA: Diagnosis not present

## 2023-03-28 DIAGNOSIS — N411 Chronic prostatitis: Secondary | ICD-10-CM

## 2023-03-28 LAB — PSA: PSA: 2.34 ng/mL (ref 0.10–4.00)

## 2023-03-28 LAB — LIPID PANEL
Cholesterol: 248 mg/dL — ABNORMAL HIGH (ref 0–200)
HDL: 39.2 mg/dL (ref >39.00)
LDL Cholesterol: 191 mg/dL — ABNORMAL HIGH (ref 0–99)
NonHDL: 208.54
Total CHOL/HDL Ratio: 6
Triglycerides: 89 mg/dL (ref 0.0–149.0)
VLDL: 17.8 mg/dL (ref 0.0–40.0)

## 2023-03-28 NOTE — Assessment & Plan Note (Signed)
 Healthy Does exercise regularly Colon due 2027 Will check PSA Still doesn't want flu/COVID/shingrix vaccines
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
## 2023-03-28 NOTE — Assessment & Plan Note (Signed)
 Worsened pain now No worrisome findings--may need to go back to ortho

## 2023-03-28 NOTE — Assessment & Plan Note (Signed)
 Doing well with the tamsulosin 0.8mg  daily

## 2023-03-28 NOTE — Assessment & Plan Note (Signed)
 Does well with the amitriptyline

## 2023-03-28 NOTE — Assessment & Plan Note (Signed)
Mild Will just recheck 

## 2023-03-29 MED ORDER — ATORVASTATIN CALCIUM 10 MG PO TABS: 10.0000 mg | ORAL_TABLET | Freq: Every day | ORAL | 3 refills | Status: AC

## 2023-04-29 ENCOUNTER — Other Ambulatory Visit: Payer: Self-pay | Admitting: Internal Medicine

## 2023-12-19 ENCOUNTER — Other Ambulatory Visit: Payer: Self-pay

## 2023-12-19 MED ORDER — CYCLOBENZAPRINE HCL 10 MG PO TABS: ORAL_TABLET | ORAL | 0 refills | Status: AC

## 2024-01-17 ENCOUNTER — Other Ambulatory Visit: Payer: Self-pay | Admitting: Internal Medicine

## 2024-01-17 DIAGNOSIS — Z7689 Persons encountering health services in other specified circumstances: Secondary | ICD-10-CM

## 2024-01-17 DIAGNOSIS — E782 Mixed hyperlipidemia: Secondary | ICD-10-CM

## 2024-01-19 ENCOUNTER — Other Ambulatory Visit: Payer: Self-pay | Admitting: Family

## 2024-02-06 ENCOUNTER — Ambulatory Visit
Admission: RE | Admit: 2024-02-06 | Discharge: 2024-02-06 | Disposition: A | Discharge status: Home / Self Care | Payer: Self-pay | Source: Ambulatory Visit | Attending: Internal Medicine | Admitting: Internal Medicine

## 2024-02-06 DIAGNOSIS — E782 Mixed hyperlipidemia: Secondary | ICD-10-CM | POA: Insufficient documentation

## 2024-02-06 DIAGNOSIS — Z7689 Persons encountering health services in other specified circumstances: Secondary | ICD-10-CM | POA: Insufficient documentation
